# Patient Record
Sex: Female | Born: 1970 | Race: White | Hispanic: No | Marital: Married | Smoking: Never smoker
Health system: Southern US, Community
[De-identification: ages and names within clinical notes are randomized; demographics above are authoritative.]

## PROBLEM LIST (undated history)

## (undated) DIAGNOSIS — J302 Other seasonal allergic rhinitis: Secondary | ICD-10-CM

## (undated) DIAGNOSIS — G629 Polyneuropathy, unspecified: Secondary | ICD-10-CM

## (undated) DIAGNOSIS — H539 Unspecified visual disturbance: Secondary | ICD-10-CM

## (undated) DIAGNOSIS — R519 Headache, unspecified: Secondary | ICD-10-CM

## (undated) DIAGNOSIS — E890 Postprocedural hypothyroidism: Secondary | ICD-10-CM

## (undated) DIAGNOSIS — C73 Malignant neoplasm of thyroid gland: Secondary | ICD-10-CM

## (undated) DIAGNOSIS — J189 Pneumonia, unspecified organism: Secondary | ICD-10-CM

## (undated) DIAGNOSIS — E7800 Pure hypercholesterolemia, unspecified: Secondary | ICD-10-CM

## (undated) DIAGNOSIS — R590 Localized enlarged lymph nodes: Secondary | ICD-10-CM

## (undated) DIAGNOSIS — D649 Anemia, unspecified: Secondary | ICD-10-CM

## (undated) DIAGNOSIS — F40243 Fear of flying: Secondary | ICD-10-CM

## (undated) DIAGNOSIS — K219 Gastro-esophageal reflux disease without esophagitis: Secondary | ICD-10-CM

## (undated) HISTORY — PX: DILATION AND CURETTAGE OF UTERUS: SHX78

## (undated) HISTORY — DX: Pure hypercholesterolemia, unspecified: E78.00

## (undated) HISTORY — DX: Malignant neoplasm of thyroid gland: C73

## (undated) HISTORY — DX: Polyneuropathy, unspecified: G62.9

## (undated) HISTORY — DX: Unspecified visual disturbance: H53.9

## (undated) HISTORY — PX: THYROIDECTOMY, PARTIAL: SHX18

## (undated) HISTORY — DX: Other seasonal allergic rhinitis: J30.2

## (undated) HISTORY — DX: Fear of flying: F40.243

## (undated) HISTORY — DX: Postprocedural hypothyroidism: E89.0

---

## 2006-06-11 ENCOUNTER — Ambulatory Visit (HOSPITAL_COMMUNITY): Admission: RE | Admit: 2006-06-11 | Discharge: 2006-06-11 | Payer: Self-pay | Admitting: Obstetrics and Gynecology

## 2006-06-20 ENCOUNTER — Inpatient Hospital Stay (HOSPITAL_COMMUNITY): Admission: AD | Admit: 2006-06-20 | Discharge: 2006-06-22 | Payer: Self-pay | Admitting: Obstetrics and Gynecology

## 2006-12-07 ENCOUNTER — Other Ambulatory Visit: Admission: RE | Admit: 2006-12-07 | Discharge: 2006-12-07 | Payer: Self-pay | Admitting: Obstetrics and Gynecology

## 2013-11-21 ENCOUNTER — Other Ambulatory Visit: Payer: Self-pay | Admitting: Family Medicine

## 2013-11-21 DIAGNOSIS — E049 Nontoxic goiter, unspecified: Secondary | ICD-10-CM

## 2013-11-30 ENCOUNTER — Ambulatory Visit
Admission: RE | Admit: 2013-11-30 | Discharge: 2013-11-30 | Disposition: A | Discharge status: Home / Self Care | Payer: BC Managed Care – PPO | Source: Ambulatory Visit | Attending: Family Medicine | Admitting: Family Medicine

## 2013-11-30 DIAGNOSIS — E049 Nontoxic goiter, unspecified: Secondary | ICD-10-CM

## 2013-12-21 ENCOUNTER — Ambulatory Visit (INDEPENDENT_AMBULATORY_CARE_PROVIDER_SITE_OTHER): Payer: BC Managed Care – PPO | Admitting: Internal Medicine

## 2013-12-21 ENCOUNTER — Encounter: Payer: Self-pay | Admitting: Internal Medicine

## 2013-12-21 VITALS — BP 106/68 | HR 83 | Temp 98.1°F | Resp 12 | Ht 67.0 in | Wt 182.0 lb

## 2013-12-21 DIAGNOSIS — E7800 Pure hypercholesterolemia, unspecified: Secondary | ICD-10-CM

## 2013-12-21 DIAGNOSIS — E042 Nontoxic multinodular goiter: Secondary | ICD-10-CM

## 2013-12-21 HISTORY — DX: Pure hypercholesterolemia, unspecified: E78.00

## 2013-12-21 LAB — TSH: TSH: 1.24 u[IU]/mL (ref 0.35–4.50)

## 2013-12-21 LAB — T3, FREE: T3 FREE: 2.6 pg/mL (ref 2.3–4.2)

## 2013-12-21 LAB — T4, FREE: Free T4: 0.85 ng/dL (ref 0.60–1.60)

## 2013-12-21 NOTE — Progress Notes (Signed)
Patient ID: Barbara Cardenas, female   DOB: [birth year], 43 y.o.   MRN: [id number]   HPI  Barbara Cardenas is a 43 y.o.-year-old female, referred by her PCP, Dr. Drema Dallas in consultation for multiple thyroid nodules.  She saw her PCP for tremors, palpitations, tingling all over her body, fatigue, R posterior neck pain, HA, insomnia. She checked TSH >> normal. Her thyroid was felt to be enlarged >> sent for thyroid U/S.   Thyroid U/S (11/30/2013) - reviewed images with the pt - she has 3 small nodules in the R lobe: Right thyroid lobe: 49 x 15 x 16 mm.   9 x 6 x 9 mm hypoechoic solid nodule, mid lobe.   Adjacent echogenic 8 x 5 x 6 mm nodule.   Medially is a 10 x 8 x 10 mm nodule with calcific rim.  Left thyroid lobe: 41 x 13 x 13 mm.   Two small 2 mm cysts in the mid lobe. No solid nodule.  Isthmus Thickness: 1 mm. No nodules visualized.  Lymphadenopathy None visualized.  I reviewed pt's thyroid tests - normal: 11/18/2013: TSH 1.71, fT4 0.77   Pt denies feeling nodules in neck, hoarseness, dysphagia/odynophagia, SOB with lying down.  Pt c/o: - + tremors - no heat intolerance  - no palpitations now but occasionally  - + anxiety/no depression - + hyperdefecation - no weight loss, + weight gain 15 lbs in the last 1.5 years - no dry skin - + hair falling - + fatigue  Pt does not have a FH of thyroid ds. No FH of thyroid cancer. No h/o radiation tx to head or neck.  Eating more seaweed lately (sushi), no recent contrast studies. No steroid use. No herbal supplements.   ROS: Constitutional: see HPI Eyes: no blurry vision, no xerophthalmia ENT: + sore throat, no nodules palpated in throat, + occasional dysphagia/odynophagia, no hoarseness, + tinnitus Cardiovascular: no CP/+ SOB/+ palpitations/no leg swelling Respiratory: no cough/+ SOB Gastrointestinal: no N/V/D/C/+ acid reflux Musculoskeletal: no muscle/joint aches Skin: no rashes Neurological: no  tremors/numbness/tingling/dizziness, + HA Psychiatric: no depression/+ anxiety  PMH: - anxiety - HL  PSxH: - D and C after miscarriage in 2004  History   Social History  . Marital Status: Married    Spouse Name: N/A    Number of Children: 3: 55, 60 and 44 y/o   Occupational History  . Stay at home mom   Social History Main Topics  . Smoking status: Never Smoker   . Smokeless tobacco: Not on file  . Alcohol Use: Occasional wine 1 glass/mo  . Drug Use: No   Current Outpatient Rx  Name  Route  Sig  Dispense  Refill  . Adapalene-Benzoyl Peroxide (EPIDUO) 0.1-2.5 % gel   Topical   Apply topically at bedtime.         . ALPRAZolam (XANAX) 0.5 MG tablet   Oral   Take 0.5 mg by mouth as needed for anxiety.         . Beclomethasone Dipropionate (QNASL) 80 MCG/ACT AERS   Nasal   Place into the nose.         . levocetirizine (XYZAL) 5 MG tablet   Oral   Take 5 mg by mouth every evening.         . montelukast (SINGULAIR) 10 MG tablet   Oral   Take 10 mg by mouth at bedtime. 1 tablet in the evening         . TRETINOIN PO  Oral   Take 45 g by mouth daily.          NKDA  FH: see HPI  PE: BP 106/68  Pulse 83  Temp(Src) 98.1 F (36.7 C) (Oral)  Resp 12  Ht 5\' 7"  (1.702 m)  Wt 182 lb (82.555 kg)  BMI 28.50 kg/m2  SpO2 97% Wt Readings from Last 3 Encounters:  12/21/13 182 lb (82.555 kg)   Constitutional: overweight, in NAD Eyes: PERRLA, EOMI, no exophthalmos ENT: moist mucous membranes, + R thyroid lobe prominent, no cervical lymphadenopathy Cardiovascular: RRR, No MRG Respiratory: CTA B Gastrointestinal: abdomen soft, NT, ND, BS+ Musculoskeletal: no deformities, strength intact in all 4;  Skin: moist, warm, no rashes Neurological: no tremor with outstretched hands, DTR normal in all 4  ASSESSMENT: 1. MNG - thyroid U/S: 3 small nodules in R lobe  PLAN: 1. MNG  - I reviewed the images of her thyroid ultrasound along with the patient. I  pointed out that the nodules are small. One is hyperechoic (which carries a lower risk of cancer;  without calcifications. One of them has internal blood flow, which may mean increased activity. Pt does not have a thyroid cancer family history or a personal history of RxTx to head/neck. All these would favor benignity.  - the only way that we can tell exactly if it is cancer or not is by doing a thyroid biopsy (FNA), but this is usually not mandated for nodules <1.5 cm. - I suggested to repeat another thyroid U/S in 1 year and see if any of the nodules grow and Bx the growing ones if we need to - we reviewed her previous TSH level from a month ago >> normal - we decided to recheck her thyroid tests today, especially since she has symptoms that are indicative of hyperthyroidism and she has 1 thyroid nodule that appears to have more activity. If the TSH returns low, will check an uptake and scan. If the TSH returns normal, I advised her to let me know if her symptoms worsen and we'll repeat a TSH at that time. - she should also let me know if she develops neck compression symptoms - I'll see her back in a year - I will send her the results through my chart  Office Visit on 12/21/2013  Component Date Value Ref Range Status  . TSH 12/21/2013 1.24  0.35 - 4.50 uIU/mL Final  . Free T4 12/21/2013 0.85  0.60 - 1.60 ng/dL Final  . T3, Free 12/21/2013 2.6  2.3 - 4.2 pg/mL Final   The thyroid tests are normal.  - time spent with the patient: 1 hour, of which >50% was spent in obtaining information about her symptoms, reviewing her previous labs, evaluations, and treatments, counseling her about her condition (please see the discussed topics above), and developing a plan to further investigate it. she had a number of questions which I addressed.

## 2013-12-21 NOTE — Patient Instructions (Signed)
Please stop at the lab.  Please let me know if your symptoms worsen or if you develop problems swallowing, persistent cough, hoarseness.   Please return in 1 year.

## 2014-06-29 ENCOUNTER — Other Ambulatory Visit: Payer: Self-pay

## 2014-06-29 DIAGNOSIS — Z1231 Encounter for screening mammogram for malignant neoplasm of breast: Secondary | ICD-10-CM

## 2014-07-12 ENCOUNTER — Ambulatory Visit
Admission: RE | Admit: 2014-07-12 | Discharge: 2014-07-12 | Disposition: A | Discharge status: Home / Self Care | Payer: BLUE CROSS/BLUE SHIELD | Source: Ambulatory Visit

## 2014-07-12 DIAGNOSIS — Z1231 Encounter for screening mammogram for malignant neoplasm of breast: Secondary | ICD-10-CM

## 2014-08-15 ENCOUNTER — Other Ambulatory Visit: Payer: Self-pay | Admitting: Gastroenterology

## 2014-08-15 DIAGNOSIS — R1310 Dysphagia, unspecified: Secondary | ICD-10-CM

## 2014-08-16 ENCOUNTER — Other Ambulatory Visit: Payer: Self-pay | Admitting: Obstetrics and Gynecology

## 2014-08-16 ENCOUNTER — Other Ambulatory Visit (HOSPITAL_COMMUNITY)
Admission: RE | Admit: 2014-08-16 | Discharge: 2014-08-16 | Disposition: A | Discharge status: Home / Self Care | Payer: BLUE CROSS/BLUE SHIELD | Source: Ambulatory Visit | Attending: Obstetrics and Gynecology | Admitting: Obstetrics and Gynecology

## 2014-08-16 DIAGNOSIS — Z01419 Encounter for gynecological examination (general) (routine) without abnormal findings: Secondary | ICD-10-CM | POA: Diagnosis not present

## 2014-08-16 DIAGNOSIS — Z1151 Encounter for screening for human papillomavirus (HPV): Secondary | ICD-10-CM | POA: Diagnosis present

## 2014-08-17 LAB — CYTOLOGY - PAP

## 2014-08-23 ENCOUNTER — Ambulatory Visit
Admission: RE | Admit: 2014-08-23 | Discharge: 2014-08-23 | Disposition: A | Discharge status: Home / Self Care | Payer: BLUE CROSS/BLUE SHIELD | Source: Ambulatory Visit | Attending: Gastroenterology | Admitting: Gastroenterology

## 2014-08-23 DIAGNOSIS — R1310 Dysphagia, unspecified: Secondary | ICD-10-CM

## 2014-11-20 ENCOUNTER — Telehealth: Payer: Self-pay | Admitting: Internal Medicine

## 2014-11-20 NOTE — Telephone Encounter (Signed)
Please read message below and advise.  

## 2014-11-20 NOTE — Telephone Encounter (Signed)
Please call pt to let her know if she needs to have another [place] before she returns for her 1 yr fu

## 2014-11-20 NOTE — Telephone Encounter (Signed)
No, will establish if she needs one when she comes back.

## 2014-11-20 NOTE — Telephone Encounter (Signed)
Called pt and lvm advising her per Dr Arman Filter message below. Advised pt to call back w/ any further questions.

## 2014-11-28 ENCOUNTER — Encounter: Payer: Self-pay | Admitting: Internal Medicine

## 2015-02-07 ENCOUNTER — Ambulatory Visit: Payer: BLUE CROSS/BLUE SHIELD | Admitting: Internal Medicine

## 2015-04-02 ENCOUNTER — Ambulatory Visit: Payer: BLUE CROSS/BLUE SHIELD | Admitting: Internal Medicine

## 2015-04-02 ENCOUNTER — Ambulatory Visit (INDEPENDENT_AMBULATORY_CARE_PROVIDER_SITE_OTHER): Payer: BLUE CROSS/BLUE SHIELD | Admitting: Internal Medicine

## 2015-04-02 ENCOUNTER — Encounter: Payer: Self-pay | Admitting: Internal Medicine

## 2015-04-02 VITALS — BP 118/68 | HR 84 | Temp 97.7°F | Resp 12 | Wt 182.6 lb

## 2015-04-02 DIAGNOSIS — E042 Nontoxic multinodular goiter: Secondary | ICD-10-CM | POA: Diagnosis not present

## 2015-04-02 LAB — TSH: TSH: 1.88 u[IU]/mL (ref 0.35–4.50)

## 2015-04-02 LAB — T4, FREE: FREE T4: 0.73 ng/dL (ref 0.60–1.60)

## 2015-04-02 LAB — T3, FREE: T3, Free: 3.3 pg/mL (ref 2.3–4.2)

## 2015-04-02 NOTE — Progress Notes (Addendum)
Patient ID: Barbara Cardenas, female   DOB: 1970-05-13, 45 y.o.   MRN: [id number]   HPI  Barbara Cardenas is a 45 y.o.-year-old female, initially referred by her PCP, Dr. Drema Dallas, now returning for f/u for  multiple thyroid nodules.  Reviewed hx: She initially saw her PCP for tremors, palpitations, tingling all over her body, fatigue, R posterior neck pain, HA, insomnia. She checked TSH >> normal. Her thyroid was felt to be enlarged >> sent for thyroid U/S.   Reviewed available imaging test images and reports: Thyroid U/S (11/30/2013) - reviewed images with the pt - she has 3 small nodules in the R lobe: Right thyroid lobe: 49 x 15 x 16 mm.   9 x 6 x 9 mm hypoechoic solid nodule, mid lobe.   Adjacent echogenic 8 x 5 x 6 mm nodule.   Medially is a 10 x 8 x 10 mm nodule with calcific rim.  Left thyroid lobe: 41 x 13 x 13 mm.   Two small 2 mm cysts in the mid lobe. No solid nodule.  Isthmus Thickness: 1 mm. No nodules visualized.  Lymphadenopathy None visualized.      Barium swallow (08/23/2014): no external compression  I reviewed pt's thyroid tests - normal: Lab Results  Component Value Date   TSH 1.24 12/21/2013   FREET4 0.85 12/21/2013  11/18/2013: TSH 1.71, fT4 0.77   Pt denies feeling nodules in neck, hoarseness, dysphagia/odynophagia, SOB with lying down. She had dysphagia >> saw GI >> started acid reflux meds >> 90% better.  Pt denies: - fatigue - weight loss or gain - tremors - heat intolerance  - palpitations - anxiety/depression - constipation/hyperdefecation - dry skin - hair loss  Pt does not have a FH of thyroid ds. No FH of thyroid cancer. No h/o radiation tx to head or neck.  Eating more seaweed lately (sushi), no recent contrast studies. No steroid use. No herbal supplements.   ROS: Constitutional: no weight gain/loss, no fatigue, no subjective hyperthermia/hypothermia Eyes: no blurry vision, no xerophthalmia ENT: no sore throat, no nodules  palpated in throat, no dysphagia/odynophagia, no hoarseness Cardiovascular: no CP/SOB/palpitations/leg swelling Respiratory: no cough/SOB Gastrointestinal: no N/V/D/C Musculoskeletal: no muscle/joint aches Skin: no rashes Neurological: no tremors/numbness/tingling/dizziness  PMH: - anxiety - HL  PSxH: - D and C after miscarriage in 2004  History   Social History  . Marital Status: Married    Spouse Name: N/A    Number of Children: 3: 13, 19 and 10 y/o   Occupational History  . Stay at home mom   Social History Main Topics  . Smoking status: Never Smoker   . Smokeless tobacco: Not on file  . Alcohol Use: Occasional wine 1 glass/mo  . Drug Use: No   Current Outpatient Rx  Name  Route  Sig  Dispense  Refill  . Adapalene-Benzoyl Peroxide (EPIDUO) 0.1-2.5 % gel   Topical   Apply topically at bedtime.         . ALPRAZolam (XANAX) 0.5 MG tablet   Oral   Take 0.5 mg by mouth as needed for anxiety.         . Beclomethasone Dipropionate (QNASL) 80 MCG/ACT AERS   Nasal   Place into the nose.         . levocetirizine (XYZAL) 5 MG tablet   Oral   Take 5 mg by mouth every evening.         . montelukast (SINGULAIR) 10 MG tablet   Oral  Take 10 mg by mouth at bedtime. 1 tablet in the evening         . TRETINOIN PO   Oral   Take 45 g by mouth daily.          NKDA  FH: see HPI  PE: BP 118/68 mmHg  Pulse 84  Temp(Src) 97.7 F (36.5 C) (Oral)  Resp 12  Wt 182 lb 9.6 oz (82.827 kg)  SpO2 98% Wt Readings from Last 3 Encounters:  04/02/15 182 lb 9.6 oz (82.827 kg)  12/21/13 182 lb (82.555 kg)   Constitutional: overweight, in NAD Eyes: PERRLA, EOMI, no exophthalmos ENT: moist mucous membranes, + R thyroid lobe prominent - R medial nodule palpable, firm; no cervical lymphadenopathy Cardiovascular: RRR, No MRG Respiratory: CTA B Gastrointestinal: abdomen soft, NT, ND, BS+ Musculoskeletal: no deformities, strength intact in all 4;  Skin: moist, warm,  no rashes Neurological: no tremor with outstretched hands, DTR normal in all 4  ASSESSMENT: 1. MNG - thyroid U/S: 3 small nodules in R lobe  PLAN: 1. MNG  - I reviewed the images of her thyroid ultrasound along with the patient. I pointed out that the nodules are small. One is hyperechoic (which carries a lower risk of cancer;  without calcifications. One of them has internal blood flow, which may mean increased activity or cancer. 2 of the nodules are adjacent. Their contours are not well defined. Pt does not have a thyroid cancer family history or a personal history of RxTx to head/neck, favoring  benignity.  - I suggested to repeat another thyroid U/S now. If the nodules are not decreasing, I would be prone to Bx them as they have several features that require attention:the dominant nodule has interrupted peripheral calcifications, and they all have increased blood flow, hypoechogenicity. She agrees with the plan. We discussed about ThyCa as a very indolent cancer, which, however, require thyroidectomy  - we reviewed her previous TSH level from 1 year ago >> normal >> will repeat today. If the TSH returns low, will check an uptake and scan.  - I'll see her back in a year - I will send her the results through my chart  Office Visit on 04/02/2015  Component Date Value Ref Range Status  . Free T4 04/02/2015 0.73  0.60 - 1.60 ng/dL Final  . T3, Free 04/02/2015 3.3  2.3 - 4.2 pg/mL Final  . TSH 04/02/2015 1.88  0.35 - 4.50 uIU/mL Final   Normal TFTs.  Thyroid U/S (04/18/2015):  Right thyroid lobe: 52 x 14 x 17 mm.   10 x 9 x 11 mm hypoechoic solid nodule with peripheral calcification, mid lobe near isthmus (previously 10 x 8 x 10).    9 x 7 x 9 mm hypoechoic nodule, mid lobe (previously 9 x 6 x 9).   Adjacent 5 x 7 x 7 mm hypoechoic nodule inferiorly (previously 8 x 5 x 6). Regional hyperemia. Left thyroid lobe: 41 x 10 x 13 mm. 2 mm cyst, mid lobe. Isthmus Thickness: 1.4 mm. 6 mm  elongated nodule, left of midline. Lymphadenopathy None visualized.  IMPRESSION: 1. Little change in small thyroid nodules. Findings do not meet current consensus criteria for biopsy.   Per our discussion, I would suggest biopsy of the 2 largest nodules in the right lobe.  Adequacy Reason Satisfactory For Evaluation. Diagnosis THYROID, RIGHT MIDDLE POLE NEAR ISTHMUS #1, FINE NEEDLE ASPIRATION (SPECIMEN 1 OF 2, COLLECTED ON 05/15/15): SUSPICIOUS FOR PAPILLARY THYROID CARCINOMA ([place]DA CATEGORY V). ROBERT  HILLARD MD Pathologist, Electronic Signature (Case signed 05/17/2015) Specimen Clinical Information 10 x 9 x 11 mm hypoechoic solid nodule with peripheral calcification, Right mid lobe near isthmus (previously 10 x 8 x 10).  Source  Thyroid, Fine Needle Aspiration, RT Lobe RMP Near Isthmus #1, (Specimen 1 of 2, collected on 05/15/15 )  Adequacy Reason Satisfactory For Evaluation. Diagnosis THYROID, RIGHT MIDDLE POLE #2, FINE NEEDLE ASPIRATION (SPECIMEN 2 OF 2, COLLECTED ON 04/26/15): SUSPICIOUS FOR PAPILLARY THYROID CARCINOMA (BETHESDA CATEGORY V). Willeen Niece MD Pathologist, Electronic Signature (Case signed 05/17/2015) Specimen Clinical Information 9 x 7 x 9 mm hypoechoic nodule, Right mid lobe (previously 9 x 6 x 9). Source Thyroid, Fine Needle Aspiration, RT Lobe RMP #2, (Specimen 2 of 2, collected on 05/15/15 )  I will suggest thyroidectomy >> refer to Sx >> I will see her back 1 mo after her surgery. Called pt >> agrees with plan.

## 2015-04-02 NOTE — Patient Instructions (Signed)
Please stop at the lab.  We will schedule a thyroid U/S.  Please return in 1 year.

## 2015-04-09 ENCOUNTER — Telehealth: Payer: Self-pay | Admitting: Internal Medicine

## 2015-04-09 NOTE — Telephone Encounter (Signed)
Patient stated that no one has called her to schedule appointment for her ultra sound, please advise

## 2015-04-09 NOTE — Telephone Encounter (Signed)
Please read message below and advise.  

## 2015-04-10 ENCOUNTER — Other Ambulatory Visit: Payer: Self-pay | Admitting: Internal Medicine

## 2015-04-10 DIAGNOSIS — E042 Nontoxic multinodular goiter: Secondary | ICD-10-CM

## 2015-04-10 NOTE — Telephone Encounter (Signed)
Barbara Cardenas, can you please schedule this for her in [place] today?

## 2015-04-18 ENCOUNTER — Ambulatory Visit
Admission: RE | Admit: 2015-04-18 | Discharge: 2015-04-18 | Disposition: A | Discharge status: Home / Self Care | Payer: BLUE CROSS/BLUE SHIELD | Source: Ambulatory Visit | Attending: Internal Medicine | Admitting: Internal Medicine

## 2015-05-09 ENCOUNTER — Other Ambulatory Visit: Payer: Self-pay | Admitting: Emergency Medicine

## 2015-05-15 ENCOUNTER — Ambulatory Visit
Admission: RE | Admit: 2015-05-15 | Discharge: 2015-05-15 | Disposition: A | Discharge status: Home / Self Care | Payer: BLUE CROSS/BLUE SHIELD | Source: Ambulatory Visit | Attending: Internal Medicine | Admitting: Internal Medicine

## 2015-05-15 ENCOUNTER — Other Ambulatory Visit (HOSPITAL_COMMUNITY)
Admission: RE | Admit: 2015-05-15 | Discharge: 2015-05-15 | Disposition: A | Discharge status: Home / Self Care | Payer: BLUE CROSS/BLUE SHIELD | Source: Ambulatory Visit | Attending: Diagnostic Radiology | Admitting: Diagnostic Radiology

## 2015-05-15 DIAGNOSIS — E042 Nontoxic multinodular goiter: Secondary | ICD-10-CM

## 2015-05-17 NOTE — Addendum Note (Signed)
Addended by: Philemon Kingdom on: 05/17/2015 02:06 PM   Modules accepted: Orders

## 2015-05-22 ENCOUNTER — Ambulatory Visit: Payer: Self-pay | Admitting: Surgery

## 2015-05-24 ENCOUNTER — Encounter: Payer: Self-pay | Admitting: Internal Medicine

## 2015-05-24 ENCOUNTER — Telehealth: Payer: Self-pay | Admitting: Internal Medicine

## 2015-05-24 NOTE — Telephone Encounter (Signed)
Patient stated that he is going to [facility] tomorrow to get a second opinion, the only thing is missing is her needle aspiration report, fax to the referral coordinator   Fax # [phone] phone # [phone]

## 2015-05-25 NOTE — Telephone Encounter (Signed)
Report printed and faxed to the number listed below.

## 2015-05-25 NOTE — Telephone Encounter (Signed)
Barbara Cardenas, can you look through her chart for this and fax it? I'm pretty sure I faxed it along with the other notes. It is in imaging. Will you please help with this for the pt? Thanks for your help!

## 2015-05-31 ENCOUNTER — Ambulatory Visit (INDEPENDENT_AMBULATORY_CARE_PROVIDER_SITE_OTHER): Payer: BLUE CROSS/BLUE SHIELD | Admitting: Neurology

## 2015-05-31 ENCOUNTER — Encounter: Payer: Self-pay | Admitting: Neurology

## 2015-05-31 VITALS — BP 110/78 | HR 88 | Resp 20 | Ht 68.0 in | Wt 181.0 lb

## 2015-05-31 DIAGNOSIS — H539 Unspecified visual disturbance: Secondary | ICD-10-CM

## 2015-05-31 DIAGNOSIS — G629 Polyneuropathy, unspecified: Secondary | ICD-10-CM | POA: Diagnosis not present

## 2015-05-31 DIAGNOSIS — R202 Paresthesia of skin: Secondary | ICD-10-CM | POA: Diagnosis not present

## 2015-05-31 DIAGNOSIS — G8929 Other chronic pain: Secondary | ICD-10-CM | POA: Diagnosis not present

## 2015-05-31 DIAGNOSIS — M2550 Pain in unspecified joint: Secondary | ICD-10-CM

## 2015-05-31 DIAGNOSIS — M6281 Muscle weakness (generalized): Secondary | ICD-10-CM | POA: Insufficient documentation

## 2015-05-31 DIAGNOSIS — R251 Tremor, unspecified: Secondary | ICD-10-CM

## 2015-05-31 DIAGNOSIS — M545 Low back pain, unspecified: Secondary | ICD-10-CM

## 2015-05-31 DIAGNOSIS — M6289 Other specified disorders of muscle: Secondary | ICD-10-CM

## 2015-05-31 NOTE — Progress Notes (Signed)
[facility]    Provider:  Dr Jaynee Eagles Referring Provider: Leighton Ruff, MD Primary Care Physician:  Gerrit Heck, MD  CC:  Right-sided pain  HPI:  Barbara Cardenas is a 45 y.o. female here as a referral from Dr. Drema Dallas for right-sided pain in the low back and shoulder joint pain and knee pain. PMHx papillary thyroid cancer, anxiety, GERD, hypercholesterolemia, paresthesias, arthralgia, myalgia. She feels the pain in her lower extremity radiates from the abdomen to the groin and into the low back. She can't lay on her right side to sleep at night. She denies radicular symptoms. She had numbness and tingling in the righ arm and right leg. She feels like something wet is running down the front of her leg. Down the front of her leg. She feels shaky in the morning. Recently she is having pain in her left foot. She went to [place]. They did not do any imaging there and said she was fine and could not help her. Symptoms started 18 months ago. Getting worse, slowly progressively. She has generalized weakness in the muscles. She has low energy. Difficulty holding arms overhead. Weakness comes and goes. No difficulty getting out of low seats or climbing stairs. Father had an autoimmune disorder, ulcerative colitis. She has joint pain every day. She has lower back pain every day. In the hip and shooulder as far as joint pain. She has lower back pain all the time. When she bends forward she feels like she is shaky. She has hearing changes. She has brief visual changes.  This is significantly affecting her life and she cries in hte office today. Denies diplopia, dysphagia, dysarthria, change in bowel bladder.  She denies mood diroders, systemic signs.   Reviewed notes, labs and imaging from outside physicians, which showed: RF, cbc, cmp, ANA IFA, CK, aldolase unremarkable.   Reviewed notes from [facility] physicians. Patient reported continued right-sided pain in her  shoulder, hip elbow and feet. Trouble laying on her right side. Also low back pain. She denied numbness but did endorse tingling in the right leg off and on. Shaky feeling. Endorse her symptoms getting worse. She was seen for this a number of months previous appointment and sent over to orthopedics to did not see anything wrong. She has done research over the inernet and is afraid that there is something worse going on. She was diagnosed with arthralgia, myalgia, paresthesia. Workup was done including rheumatoid arthritis, aldolase, CBC, CMP, CK which were all unremarkable. She was referred here for further workup.  Review of Systems: Patient complains of symptoms per HPI as well as the following symptoms: ringing in ears, fatigue, joint pain, allergies, not enough sleep . Pertinent negatives per HPI. All others negative.   Social History   Social History  . Marital Status: Married    Spouse Name: N/A  . Number of Children: N/A  . Years of Education: N/A   Occupational History  . Not on file.   Social History Main Topics  . Smoking status: Never Smoker   . Smokeless tobacco: Not on file  . Alcohol Use: No  . Drug Use: No  . Sexual Activity: Not on file   Other Topics Concern  . Not on file   Social History Narrative    Family History  Problem Relation Age of Onset  . Healthy Mother   . CAD Father   . Allergies Father   . Ulcerative colitis Father   . Tremor Father   . Neuropathy  Neg Hx     Past Medical History  Diagnosis Date  . Thyroid cancer ([place])   . Neuropathy ([place])   . Vision abnormalities     Past Surgical History  Procedure Laterality Date  . Biopsy thyroid      Current Outpatient Prescriptions  Medication Sig Dispense Refill  . Adapalene-Benzoyl Peroxide (EPIDUO) 0.1-2.5 % gel Apply topically at bedtime.    . ALPRAZolam (XANAX) 0.5 MG tablet Take 0.5 mg by mouth as needed for anxiety. Reported on 04/02/2015    . Beclomethasone Dipropionate (QNASL) 80  MCG/ACT AERS Place into the nose.    . Cholecalciferol (VITAMIN D PO) Take 4,000 Units by mouth.    . fluticasone (FLONASE) 50 MCG/ACT nasal spray Place into both nostrils daily.    Marland Kitchen levocetirizine (XYZAL) 5 MG tablet Take 5 mg by mouth every evening.    . montelukast (SINGULAIR) 10 MG tablet Take 10 mg by mouth at bedtime. 1 tablet in the evening    . Multiple Vitamin (MULTI-VITAMINS) TABS Take by mouth.     No current facility-administered medications for this visit.    Allergies as of 05/31/2015  . (No Known Allergies)    Vitals: BP 110/78 mmHg  Pulse 88  Resp 20  Ht 5\' 8"  (1.727 m)  Wt 181 lb (82.101 kg)  BMI 27.53 kg/m2 Last Weight:  Wt Readings from Last 1 Encounters:  05/31/15 181 lb (82.101 kg)   Last Height:   Ht Readings from Last 1 Encounters:  05/31/15 5\' 8"  (1.727 m)   Physical exam: Exam: Gen: NAD, conversant                  CV: RRR, no MRG. No Carotid Bruits. No peripheral edema, warm, nontender Eyes: Conjunctivae clear without exudates or hemorrhage  Neuro: Detailed Neurologic Exam  Speech:    Speech is normal; fluent and spontaneous with normal comprehension.  Cognition:    The patient is oriented to person, place, and time;     recent and remote memory intact;     language fluent;     normal attention, concentration,     fund of knowledge Cranial Nerves:    The pupils are equal, round, and reactive to light. The fundi are normal and spontaneous venous pulsations are present. Visual fields are full to finger confrontation. Extraocular movements are intact. Trigeminal sensation is intact and the muscles of mastication are normal. Left ptosis otherwise the face is symmetric. The palate elevates in the midline. Hearing intact. Voice is normal. Shoulder shrug is normal. The tongue has normal motion without fasciculations.   Coordination:    Normal finger to nose and heel to shin. Normal rapid alternating movements.   Gait:    Heel-toe and tandem  gait are normal.   Motor Observation:    No asymmetry, no atrophy, and no involuntary movements noted. Tone:    Normal muscle tone.    Posture:    Posture is normal. normal erect    Strength:    Strength is V/V in the upper and lower limbs.      Sensation: intact to LT     Reflex Exam:  DTR's:    Deep tendon reflexes in the upper and lower extremities are normal bilaterally.   Toes:    The toes are downgoing bilaterally.   Clonus:    Clonus is absent.      Assessment/Plan:  45 year old patient with multiple complaints that have been going on for 18  months and are progressive. Joint pain on the right side, generalized weakness, not feeling good, right-sided joint pain, paresthesias on right side, feeling internally shaky, low energy and fatigue, low back pain, hearing and visual changes. Neuro exam is unremarkable. Very difficult to tie together all these multiple complaints.   Right-sided emg (right arm and leg) Will perform a thorough neuropathy serum screen MRI of the brain   Sarina Ill, MD  [facility] [address] [place], [place] [address]  Phone [phone] Fax [phone]

## 2015-05-31 NOTE — Patient Instructions (Addendum)
Remember to drink plenty of fluid, eat healthy meals and do not skip any meals. Try to eat protein with a every meal and eat a healthy snack such as fruit or nuts in between meals. Try to keep a regular sleep-wake schedule and try to exercise daily, particularly in the form of walking, 20-30 minutes a day, if you can.   As far as diagnostic testing: MRi brain, emg/ncs, labs  Our phone number is [phone]. We also have an after hours call service for urgent matters and there is a physician on-call for urgent questions. For any emergencies you know to call 911 or go to the nearest emergency room

## 2015-06-04 LAB — HEMOGLOBIN A1C
Est. average glucose Bld gHb Est-mCnc: 114 mg/dL
HEMOGLOBIN A1C: 5.6 % (ref 4.8–5.6)

## 2015-06-04 LAB — HEPATITIS C ANTIBODY

## 2015-06-04 LAB — VITAMIN D PNL(25-HYDRXY+1,25-DIHY)-BLD
Vit D, 1,25-Dihydroxy: 77.6 pg/mL (ref 19.9–79.3)
Vit D, 25-Hydroxy: 26 ng/mL — ABNORMAL LOW (ref 30.0–100.0)

## 2015-06-04 LAB — B12 AND FOLATE PANEL
Folate: >20 ng/mL (ref >3.0)
VITAMIN B 12: 762 pg/mL (ref 211–946)

## 2015-06-04 LAB — ANA COMPREHENSIVE PANEL
Anti JO-1: <0.2 AI (ref 0.0–0.9)
Centromere Ab Screen: <0.2 AI (ref 0.0–0.9)
Chromatin Ab SerPl-aCnc: <0.2 AI (ref 0.0–0.9)
DSDNA AB: 2 [IU]/mL (ref 0–9)
ENA RNP AB: 1.9 AI — AB (ref 0.0–0.9)
ENA SSA (RO) Ab: <0.2 AI (ref 0.0–0.9)
ENA SSB (LA) Ab: <0.2 AI (ref 0.0–0.9)
Scleroderma SCL-70: <0.2 AI (ref 0.0–0.9)

## 2015-06-04 LAB — VITAMIN B6: VITAMIN B6: 13 ug/L (ref 2.0–32.8)

## 2015-06-04 LAB — HEAVY METALS, BLOOD
Arsenic: 11 ug/L (ref 2–23)
Lead, Blood: NOT DETECTED ug/dL (ref 0–19)
Mercury: NOT DETECTED ug/L (ref 0.0–14.9)

## 2015-06-04 LAB — SEDIMENTATION RATE: SED RATE: 2 mm/h (ref 0–32)

## 2015-06-04 LAB — ANA: ANA Titer 1: NEGATIVE

## 2015-06-04 LAB — VITAMIN B1: Thiamine: 92.7 nmol/L (ref 66.5–200.0)

## 2015-06-04 LAB — B. BURGDORFI ANTIBODIES: Lyme IgG/IgM Ab: <0.91 {ISR} (ref 0.00–0.90)

## 2015-06-04 LAB — RPR: RPR: NONREACTIVE

## 2015-06-04 LAB — METHYLMALONIC ACID, SERUM: Methylmalonic Acid: 75 nmol/L (ref 0–378)

## 2015-06-04 LAB — HIV ANTIBODY (ROUTINE TESTING W REFLEX): HIV SCREEN 4TH GENERATION: NONREACTIVE

## 2015-06-05 ENCOUNTER — Telehealth: Payer: Self-pay | Admitting: *Deleted

## 2015-06-05 NOTE — Telephone Encounter (Signed)
Called and spoke to pt about unremarkable labs. Pt verbalized understanding.

## 2015-06-05 NOTE — Telephone Encounter (Signed)
-----   Message from Melvenia Beam, MD sent at 06/05/2015 10:46 AM EDT ----- Labs were unremarkable thanks

## 2015-06-13 ENCOUNTER — Ambulatory Visit: Payer: BLUE CROSS/BLUE SHIELD

## 2015-06-20 ENCOUNTER — Other Ambulatory Visit: Payer: Self-pay | Admitting: Obstetrics and Gynecology

## 2015-06-20 DIAGNOSIS — N644 Mastodynia: Secondary | ICD-10-CM

## 2015-06-20 DIAGNOSIS — N6310 Unspecified lump in the right breast, unspecified quadrant: Secondary | ICD-10-CM

## 2015-06-25 ENCOUNTER — Telehealth: Payer: Self-pay | Admitting: Internal Medicine

## 2015-06-25 NOTE — Telephone Encounter (Signed)
FYI: Pt is going to have surgery done at [facility]  She is going to do the CT there as well she could have hyperparathyroidism

## 2015-06-25 NOTE — Telephone Encounter (Signed)
Noted! Reviewed [facility] notes and lab results.

## 2015-06-25 NOTE — Telephone Encounter (Signed)
Please read message below and be advised.  

## 2015-06-26 ENCOUNTER — Ambulatory Visit
Admission: RE | Admit: 2015-06-26 | Discharge: 2015-06-26 | Disposition: A | Discharge status: Home / Self Care | Payer: BLUE CROSS/BLUE SHIELD | Source: Ambulatory Visit | Attending: Obstetrics and Gynecology | Admitting: Obstetrics and Gynecology

## 2015-06-26 DIAGNOSIS — N644 Mastodynia: Secondary | ICD-10-CM

## 2015-06-26 DIAGNOSIS — N6310 Unspecified lump in the right breast, unspecified quadrant: Secondary | ICD-10-CM

## 2015-07-19 ENCOUNTER — Encounter: Payer: BLUE CROSS/BLUE SHIELD | Admitting: Neurology

## 2015-07-30 DIAGNOSIS — Z0000 Encounter for general adult medical examination without abnormal findings: Secondary | ICD-10-CM | POA: Diagnosis not present

## 2015-08-06 DIAGNOSIS — M25511 Pain in right shoulder: Secondary | ICD-10-CM | POA: Diagnosis not present

## 2015-08-06 DIAGNOSIS — M79672 Pain in left foot: Secondary | ICD-10-CM | POA: Diagnosis not present

## 2015-08-06 DIAGNOSIS — K219 Gastro-esophageal reflux disease without esophagitis: Secondary | ICD-10-CM | POA: Diagnosis not present

## 2015-08-06 DIAGNOSIS — M25552 Pain in left hip: Secondary | ICD-10-CM | POA: Diagnosis not present

## 2015-08-06 DIAGNOSIS — C73 Malignant neoplasm of thyroid gland: Secondary | ICD-10-CM | POA: Diagnosis not present

## 2015-08-06 DIAGNOSIS — M79671 Pain in right foot: Secondary | ICD-10-CM | POA: Diagnosis not present

## 2015-08-06 DIAGNOSIS — E210 Primary hyperparathyroidism: Secondary | ICD-10-CM | POA: Diagnosis not present

## 2015-08-06 DIAGNOSIS — J309 Allergic rhinitis, unspecified: Secondary | ICD-10-CM | POA: Diagnosis not present

## 2015-08-06 DIAGNOSIS — D351 Benign neoplasm of parathyroid gland: Secondary | ICD-10-CM | POA: Diagnosis not present

## 2015-08-17 DIAGNOSIS — E210 Primary hyperparathyroidism: Secondary | ICD-10-CM | POA: Diagnosis not present

## 2015-08-17 DIAGNOSIS — C73 Malignant neoplasm of thyroid gland: Secondary | ICD-10-CM | POA: Diagnosis not present

## 2015-08-22 ENCOUNTER — Ambulatory Visit (INDEPENDENT_AMBULATORY_CARE_PROVIDER_SITE_OTHER): Payer: BLUE CROSS/BLUE SHIELD | Admitting: Neurology

## 2015-08-22 ENCOUNTER — Ambulatory Visit (INDEPENDENT_AMBULATORY_CARE_PROVIDER_SITE_OTHER): Payer: Self-pay | Admitting: Neurology

## 2015-08-22 DIAGNOSIS — R202 Paresthesia of skin: Secondary | ICD-10-CM

## 2015-08-22 DIAGNOSIS — M791 Myalgia, unspecified site: Secondary | ICD-10-CM

## 2015-08-22 DIAGNOSIS — Z0289 Encounter for other administrative examinations: Secondary | ICD-10-CM

## 2015-08-22 DIAGNOSIS — M359 Systemic involvement of connective tissue, unspecified: Secondary | ICD-10-CM

## 2015-08-22 DIAGNOSIS — G629 Polyneuropathy, unspecified: Secondary | ICD-10-CM

## 2015-08-22 DIAGNOSIS — M6281 Muscle weakness (generalized): Secondary | ICD-10-CM

## 2015-08-22 DIAGNOSIS — M545 Low back pain, unspecified: Secondary | ICD-10-CM

## 2015-08-22 DIAGNOSIS — M2550 Pain in unspecified joint: Secondary | ICD-10-CM

## 2015-08-22 DIAGNOSIS — G8929 Other chronic pain: Secondary | ICD-10-CM

## 2015-08-22 NOTE — Progress Notes (Signed)
  [facility]    Provider:  Dr Jaynee Eagles Referring Provider: Leighton Ruff, MD Primary Care Physician:  Gerrit Heck, MD  History: 45 year old patient with multiple complaints that have been going on for 18 months and are progressive. Joint pain on the right side, generalized weakness, not feeling good, right-sided joint pain, paresthesias on right side, feeling internally shaky, low energy and fatigue, low back pain, hearing and visual changes. Neuro exam is unremarkable. Very difficult to tie together all these multiple complaints.   Summary  Nerve conduction studies were performed on the right upper and right lower extremities:  Right APB Median motor nerve showed normal conductions with normal F Wave latency Right ADM Ulnar motor nerves showed normal conductions with normal F Wave latency The right Peroneal motor nerve showed normal conductions with normal F Wave latency The right Tibial motor nerve showed normal conductions with normal F Wave latency The right second-digit Median sensory nerve conduction was within normal limits The right fifth-digit Ulnar sensory nerve conduction was within normal limits The right Peroneal sensory nerve conduction was within normal limits Right H Reflex showed normal latency  EMG Needle study was performed on selected right upper and lower extremity muscles:   The Deltoid, Triceps, Pronator Teres, Opponens Pollicis, Flexor Digitorum Profundus, Iliopsoas, Biceps Femoris(long head), Gluteus Maximus, Lumbar paraspinals, Vastus Medialis, Anterior Tibialis, Medial Gastrocnemius, Extensor Hallucis Longus and Abductor Hallucis muscles were within normal limits.  Conclusion: This is a normal study. No electrophysiologic evidence for peripheral polyneuropathy, mononeuropathy, radiculopathy, or neuromuscular disorder.    Also discussed low vitamin D levels, she is on supplementation now. Did an extensive serum panel, was only  positive for ENA RNP antibodies 1.9 (ULN 0.9) wil negative ANA titers. I feel this is likely not clinically relevant but given all her joint complaints she would like to see rheumatology. Will let them review the consult and see if they feel this is clinically warranted for consult.    Assessment/Plan:    Sarina Ill, MD  [facility] [address] [place], [place] [address]  Phone [phone] Fax [phone]

## 2015-08-22 NOTE — Progress Notes (Signed)
See procedure note.

## 2015-08-23 ENCOUNTER — Other Ambulatory Visit: Payer: BLUE CROSS/BLUE SHIELD

## 2015-08-24 NOTE — Procedures (Signed)
[facility]    Provider:  Dr Jaynee Eagles Referring Provider: Leighton Ruff, MD Primary Care Physician:  Gerrit Heck, MD  History: 45 year old patient with multiple complaints that have been going on for 18 months and are progressive. Joint pain on the right side, generalized weakness, not feeling good, right-sided joint pain, paresthesias on right side, feeling internally shaky, low energy and fatigue, low back pain, hearing and visual changes. Neuro exam is unremarkable. Very difficult to tie together all these multiple complaints.   Summary  Nerve conduction studies were performed on the right upper and right lower extremities:  Right APB Median motor nerve showed normal conductions with normal F Wave latency Right ADM Ulnar motor nerves showed normal conductions with normal F Wave latency The right Peroneal motor nerve showed normal conductions with normal F Wave latency The right Tibial motor nerve showed normal conductions with normal F Wave latency The right second-digit Median sensory nerve conduction was within normal limits The right fifth-digit Ulnar sensory nerve conduction was within normal limits The right Peroneal sensory nerve conduction was within normal limits Right H Reflex showed normal latency  EMG Needle study was performed on selected right upper and lower extremity muscles:   The Deltoid, Triceps, Pronator Teres, Opponens Pollicis, Flexor Digitorum Profundus, Iliopsoas, Biceps Femoris(long head), Gluteus Maximus, Lumbar paraspinals, Vastus Medialis, Anterior Tibialis, Medial Gastrocnemius, Extensor Hallucis Longus and Abductor Hallucis muscles were within normal limits.  Conclusion: This is a normal study. No electrophysiologic evidence for peripheral polyneuropathy, mononeuropathy, radiculopathy, or neuromuscular disorder.    Also discussed low vitamin D levels, she is on supplementation now. Did an extensive serum panel, was only  positive for ENA RNP antibodies 1.9 (ULN 0.9) wil negative ANA titers. I feel this is likely not clinically relevant but given all her joint complaints she would like to see rheumatology. Will let them review the consult and see if they feel this is clinically warranted for consult.    Assessment/Plan:    Sarina Ill, MD  [facility] [address] [place], [place] [address]  Phone [phone] Fax [phone]

## 2015-09-03 ENCOUNTER — Ambulatory Visit: Payer: BLUE CROSS/BLUE SHIELD | Admitting: Neurology

## 2015-09-07 DIAGNOSIS — C73 Malignant neoplasm of thyroid gland: Secondary | ICD-10-CM | POA: Diagnosis not present

## 2015-09-07 DIAGNOSIS — E890 Postprocedural hypothyroidism: Secondary | ICD-10-CM

## 2015-09-07 HISTORY — DX: Malignant neoplasm of thyroid gland: C73

## 2015-09-07 HISTORY — DX: Postprocedural hypothyroidism: E89.0

## 2015-10-02 ENCOUNTER — Other Ambulatory Visit: Payer: BLUE CROSS/BLUE SHIELD

## 2015-10-09 DIAGNOSIS — Z01419 Encounter for gynecological examination (general) (routine) without abnormal findings: Secondary | ICD-10-CM | POA: Diagnosis not present

## 2015-10-15 DIAGNOSIS — M2550 Pain in unspecified joint: Secondary | ICD-10-CM | POA: Diagnosis not present

## 2015-10-19 ENCOUNTER — Other Ambulatory Visit: Payer: BLUE CROSS/BLUE SHIELD

## 2015-11-15 DIAGNOSIS — C73 Malignant neoplasm of thyroid gland: Secondary | ICD-10-CM | POA: Diagnosis not present

## 2015-11-15 DIAGNOSIS — E890 Postprocedural hypothyroidism: Secondary | ICD-10-CM | POA: Diagnosis not present

## 2016-02-28 DIAGNOSIS — J4520 Mild intermittent asthma, uncomplicated: Secondary | ICD-10-CM | POA: Diagnosis not present

## 2016-02-28 DIAGNOSIS — J3089 Other allergic rhinitis: Secondary | ICD-10-CM | POA: Diagnosis not present

## 2016-02-28 DIAGNOSIS — J0100 Acute maxillary sinusitis, unspecified: Secondary | ICD-10-CM | POA: Diagnosis not present

## 2016-02-28 DIAGNOSIS — J301 Allergic rhinitis due to pollen: Secondary | ICD-10-CM | POA: Diagnosis not present

## 2016-02-29 DIAGNOSIS — C73 Malignant neoplasm of thyroid gland: Secondary | ICD-10-CM | POA: Diagnosis not present

## 2016-03-07 DIAGNOSIS — J3089 Other allergic rhinitis: Secondary | ICD-10-CM | POA: Diagnosis not present

## 2016-03-07 DIAGNOSIS — J3081 Allergic rhinitis due to animal (cat) (dog) hair and dander: Secondary | ICD-10-CM | POA: Diagnosis not present

## 2016-03-07 DIAGNOSIS — J301 Allergic rhinitis due to pollen: Secondary | ICD-10-CM | POA: Diagnosis not present

## 2016-04-01 ENCOUNTER — Ambulatory Visit: Payer: BLUE CROSS/BLUE SHIELD | Admitting: Internal Medicine

## 2016-04-07 DIAGNOSIS — C73 Malignant neoplasm of thyroid gland: Secondary | ICD-10-CM | POA: Diagnosis not present

## 2016-04-10 DIAGNOSIS — J1089 Influenza due to other identified influenza virus with other manifestations: Secondary | ICD-10-CM | POA: Diagnosis not present

## 2016-05-21 DIAGNOSIS — C73 Malignant neoplasm of thyroid gland: Secondary | ICD-10-CM | POA: Diagnosis not present

## 2016-07-18 ENCOUNTER — Encounter: Payer: Self-pay | Admitting: Family Medicine

## 2016-07-18 ENCOUNTER — Ambulatory Visit (INDEPENDENT_AMBULATORY_CARE_PROVIDER_SITE_OTHER): Payer: BLUE CROSS/BLUE SHIELD | Admitting: Family Medicine

## 2016-07-18 VITALS — BP 124/82 | HR 80 | Temp 98.0°F | Ht 68.0 in | Wt 179.4 lb

## 2016-07-18 DIAGNOSIS — E7800 Pure hypercholesterolemia, unspecified: Secondary | ICD-10-CM

## 2016-07-18 DIAGNOSIS — J301 Allergic rhinitis due to pollen: Secondary | ICD-10-CM

## 2016-07-18 DIAGNOSIS — F40243 Fear of flying: Secondary | ICD-10-CM | POA: Diagnosis not present

## 2016-07-18 DIAGNOSIS — E890 Postprocedural hypothyroidism: Secondary | ICD-10-CM

## 2016-07-18 DIAGNOSIS — M7701 Medial epicondylitis, right elbow: Secondary | ICD-10-CM

## 2016-07-18 DIAGNOSIS — Z85850 Personal history of malignant neoplasm of thyroid: Secondary | ICD-10-CM | POA: Diagnosis not present

## 2016-07-18 DIAGNOSIS — R600 Localized edema: Secondary | ICD-10-CM

## 2016-07-18 NOTE — Progress Notes (Signed)
Barbara Cardenas is a 46 y.o. female is here to [employer].   Patient Care Team: Leighton Ruff, MD as PCP - General (Family Medicine)   History of Present Illness:   Barbara Cardenas CMA acting as scribe for Dr. Juleen China.  CC: Patient comes in today to establish care. She is having some right ankle swelling and tenderness. Denies any injury with this. She is also having some right elbow discomfort. It is tender to the touch and some pain with motion.   HPI:  1. Right elbow pain. Onset of the symptoms was several weeks ago. Inciting event: hold her handbag. Current symptoms include: point tenderness medially. Pain is aggravated by: lifting heavy objects. Symptoms have gradually improved. Patient has had no prior elbow problems. Evaluation to date: none. Treatment to date: avoidance of offending activity.   2. Right ankle swelling. Intermittent, x several months. Worse at the end of the day. On her feet all day. No treatment. No prior injury to the ankle.   3. History of thyroid cancer. S/p thyroidectomy. Now, with resolution of most systemic complaints from before (joint pain, paresthesias, vision changes, anxiety).    4. Seasonal allergic rhinitis due to pollen. Uses Singulair, Xyzal, Qnasl, Pataday, Proair prn. No complaints today.   5. 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    Health Maintenance Due  Topic Date Due  . TETANUS/TDAP  11/05/1990   PMHx, SurgHx, SocialHx, Medications, and Allergies were reviewed in the Visit Navigator and updated as appropriate.   Past Medical History:  Diagnosis Date  . Cancer of thyroid ([place]) 09/07/2015  . 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 Hypercholesteremia 12/21/2013  . Neuropathy   . Postoperative hypothyroidism 09/07/2015  . Seasonal allergies 07/20/2016  . Thyroid cancer ([place])   . Vision abnormalities    Past Surgical History:  Procedure Laterality Date  . THYROIDECTOMY, PARTIAL     Family History  Problem Relation Age of Onset    . Healthy Mother   . CAD Father   . Allergies Father   . Ulcerative colitis Father   . Tremor Father   . Neuropathy Neg Hx    Social History  Substance Use Topics  . Smoking status: Never Smoker  . Smokeless tobacco: Never Used  . Alcohol use No   Current Medications and Allergies:   .  Adapalene-Benzoyl Peroxide (EPIDUO) 0.1-2.5 % gel, Apply topically at bedtime., Disp: , Rfl:  .  ALPRAZolam (XANAX) 0.5 MG tablet, Take 0.5 mg by mouth as needed for anxiety. Reported on 04/02/2015, Disp: , Rfl:  .  Beclomethasone Dipropionate (QNASL) 80 MCG/ACT AERS, Place into the nose., Disp: , Rfl:  .  Cholecalciferol (VITAMIN D PO), Take 4,000 Units by mouth., Disp: , Rfl:  .  levocetirizine (XYZAL) 5 MG tablet, Take 5 mg by mouth every evening., Disp: , Rfl:  .  montelukast (SINGULAIR) 10 MG tablet, Take 10 mg by mouth at bedtime. 1 tablet in the evening, Disp: , Rfl:  .  Multiple Vitamin (MULTI-VITAMINS) TABS, Take by mouth., Disp: , Rfl:   No Known Allergies   Review of Systems:   Review of Systems  Constitutional: Negative for chills, fever and malaise/fatigue.  HENT: Negative for ear pain and sinus pain.   Eyes: Negative for blurred vision and double vision.  Respiratory: Negative for cough and wheezing.   Cardiovascular: Negative for chest pain and palpitations.  Gastrointestinal: Negative for abdominal pain, constipation and vomiting.  Musculoskeletal: Positive for joint pain. Negative  for back pain and neck pain.  Neurological: Negative for dizziness and headaches.  Psychiatric/Behavioral: Negative for depression, hallucinations and memory loss.   Vitals:   Vitals:   07/18/16 0858  BP: 124/82  Pulse: 80  Temp: 98 F (36.7 C)  TempSrc: Oral  SpO2: 97%  Weight: 179 lb 6.4 oz (81.4 kg)  Height: 5\' 8"  (1.727 m)     Body mass index is 27.28 kg/m.  Physical Exam:   Physical Exam  Constitutional: She appears well-developed and well-nourished. No distress.  HENT:  Head:  Normocephalic and atraumatic.  Eyes: EOM are normal. Pupils are equal, round, and reactive to light.  Neck: Normal range of motion. Neck supple.  Cardiovascular: Normal rate, regular rhythm, normal heart sounds and intact distal pulses.   Pulmonary/Chest: Effort normal.  Abdominal: Soft.  Skin: Skin is warm.  Psychiatric: She has a normal mood and affect. Her behavior is normal.  Nursing note and vitals reviewed.   Assessment and Plan:   Barbara Cardenas was seen today for establish care.  Diagnoses and all orders for this visit:  Medial epicondylitis of right elbow Comments: New. Mild and already improving since changing handbag. If worsens, to [place].  Edema of soft tissue of right ankle region Comments: New. Minimal. Seems to be dependent edema. No red flags. Reviewed use of compression stockings.  History of thyroid cancer Comments: Most of her previous symptoms have improved since her surgery.  Hypercholesteremia Comments: No FLP on file. Will place future order.   Postoperative hypothyroidism Comments: Stable. Well controlled.  No signs of complications, medication side effects, or red flags.  Continue current regimen.    Seasonal allergic rhinitis due to pollen Comments: Stable. Controlled on current regimen.  Fear of flying Comments: Uses Xanax only when she has to fly.   . Reviewed expectations re: course of current medical issues. . Discussed self-management of symptoms. . Outlined signs and symptoms indicating need for more acute intervention. . Patient verbalized understanding and all questions were answered. Marland Kitchen Health Maintenance issues including appropriate healthy diet, exercise, and smoking avoidance were discussed with patient. . See orders for this visit as documented in the electronic medical record. . Patient received an After Visit Summary.  CMA served as Education administrator during this visit. History, Physical, and Plan performed by medical provider. The above  documentation has been reviewed and is accurate and complete. Briscoe Deutscher, D.O.  Records requested if needed. Time spent with the patient: 30 minutes, of which >50% was spent in obtaining information about her symptoms, reviewing her previous labs, evaluations, and treatments, counseling her about her condition (please see the discussed topics above), and developing a plan to further investigate it; she had a number of questions which I addressed. We reviewed all HM and active problems together.  Briscoe Deutscher, DO [place] 07/20/2016

## 2016-07-20 ENCOUNTER — Encounter: Payer: Self-pay | Admitting: Family Medicine

## 2016-07-20 DIAGNOSIS — F40243 Fear of flying: Secondary | ICD-10-CM

## 2016-07-20 DIAGNOSIS — J302 Other seasonal allergic rhinitis: Secondary | ICD-10-CM | POA: Insufficient documentation

## 2016-07-20 DIAGNOSIS — Z85850 Personal history of malignant neoplasm of thyroid: Secondary | ICD-10-CM | POA: Insufficient documentation

## 2016-07-20 HISTORY — DX: Fear of flying: F40.243

## 2016-07-20 HISTORY — DX: Other seasonal allergic rhinitis: J30.2

## 2016-08-25 DIAGNOSIS — E039 Hypothyroidism, unspecified: Secondary | ICD-10-CM | POA: Diagnosis not present

## 2016-08-25 DIAGNOSIS — N951 Menopausal and female climacteric states: Secondary | ICD-10-CM | POA: Diagnosis not present

## 2016-10-20 ENCOUNTER — Other Ambulatory Visit: Payer: Self-pay | Admitting: Obstetrics and Gynecology

## 2016-10-20 DIAGNOSIS — Z1231 Encounter for screening mammogram for malignant neoplasm of breast: Secondary | ICD-10-CM

## 2016-10-21 ENCOUNTER — Other Ambulatory Visit (HOSPITAL_COMMUNITY)
Admission: RE | Admit: 2016-10-21 | Discharge: 2016-10-21 | Disposition: A | Discharge status: Home / Self Care | Payer: BLUE CROSS/BLUE SHIELD | Source: Ambulatory Visit | Attending: Obstetrics and Gynecology | Admitting: Obstetrics and Gynecology

## 2016-10-21 ENCOUNTER — Other Ambulatory Visit: Payer: Self-pay | Admitting: Obstetrics and Gynecology

## 2016-10-21 DIAGNOSIS — Z01419 Encounter for gynecological examination (general) (routine) without abnormal findings: Secondary | ICD-10-CM | POA: Diagnosis not present

## 2016-10-22 LAB — CYTOLOGY - PAP: DIAGNOSIS: NEGATIVE

## 2016-10-30 ENCOUNTER — Ambulatory Visit
Admission: RE | Admit: 2016-10-30 | Discharge: 2016-10-30 | Disposition: A | Discharge status: Home / Self Care | Payer: BLUE CROSS/BLUE SHIELD | Source: Ambulatory Visit | Attending: Obstetrics and Gynecology | Admitting: Obstetrics and Gynecology

## 2016-10-30 DIAGNOSIS — Z1231 Encounter for screening mammogram for malignant neoplasm of breast: Secondary | ICD-10-CM

## 2016-11-03 DIAGNOSIS — E039 Hypothyroidism, unspecified: Secondary | ICD-10-CM | POA: Diagnosis not present

## 2016-12-02 ENCOUNTER — Encounter: Payer: Self-pay | Admitting: Family Medicine

## 2016-12-02 ENCOUNTER — Ambulatory Visit (INDEPENDENT_AMBULATORY_CARE_PROVIDER_SITE_OTHER): Payer: BLUE CROSS/BLUE SHIELD | Admitting: Family Medicine

## 2016-12-02 VITALS — BP 122/78 | HR 78 | Temp 98.4°F | Ht 68.0 in | Wt 184.0 lb

## 2016-12-02 DIAGNOSIS — Z136 Encounter for screening for cardiovascular disorders: Secondary | ICD-10-CM

## 2016-12-02 DIAGNOSIS — H6981 Other specified disorders of Eustachian tube, right ear: Secondary | ICD-10-CM | POA: Diagnosis not present

## 2016-12-02 DIAGNOSIS — H6991 Unspecified Eustachian tube disorder, right ear: Secondary | ICD-10-CM

## 2016-12-02 DIAGNOSIS — Z13220 Encounter for screening for lipoid disorders: Secondary | ICD-10-CM

## 2016-12-02 DIAGNOSIS — J302 Other seasonal allergic rhinitis: Secondary | ICD-10-CM | POA: Diagnosis not present

## 2016-12-02 MED ORDER — BECLOMETHASONE DIPROPIONATE 80 MCG/ACT NA AERS: INHALATION_SPRAY | NASAL | 11 refills | Status: DC

## 2016-12-02 NOTE — Progress Notes (Signed)
Barbara Cardenas is a 46 y.o. female here for an acute visit.  History of Present Illness:   Water quality scientist, CMA, acting as scribe for Dr. Juleen China.  Otalgia   There is pain in the right ear. This is a recurrent problem. The current episode started in the past 7 days. The problem has been gradually worsening. There has been no fever. The pain is mild. Associated symptoms include a sore throat. She has tried nothing for the symptoms.   PMHx, SurgHx, SocialHx, Medications, and Allergies were reviewed in the Visit Navigator and updated as appropriate.  Current Medications:   .  acetaminophen (TYLENOL) 160 mg/5 mL SOLN, Take by mouth., Disp: , Rfl:  .  albuterol (PROAIR HFA) 108 (90 Base) MCG/ACT inhaler, Reported on 06/29/2015-PRN, Disp: , Rfl:  .  Beclomethasone Dipropionate (QNASL) 80 MCG/ACT AERS, Place into the nose., Disp: , Rfl:  -NOT TAKING, WAITING ON PRIOR AUTHORIZATION .  Cholecalciferol (VITAMIN D PO), Take 4,000 Units by mouth., Disp: , Rfl:  .  levocetirizine (XYZAL) 5 MG tablet, Take 5 mg by mouth every evening., Disp: , Rfl:  .  levothyroxine (SYNTHROID, LEVOTHROID) 75 MCG tablet, Take 75 mcg by mouth daily before breakfast., Disp: , Rfl:  .  Multiple Vitamin (MULTI-VITAMINS) TABS, Take by mouth., Disp: , Rfl:  .  Olopatadine HCl (PATADAY) 0.2 % SOLN, INSTILL 1-2 DROPS INTO BOTH EYES DAILY, Disp: , Rfl:    No Known Allergies   Review of Systems:   Pertinent items are noted in the HPI. Otherwise, ROS is negative.  Vitals:   Vitals:   12/02/16 1325  BP: 122/78  Pulse: 78  Temp: 98.4 F (36.9 C)  TempSrc: Oral  SpO2: 97%  Weight: 184 lb (83.5 kg)  Height: 5\' 8"  (1.727 m)     Body mass index is 27.98 kg/m.   Physical Exam:   Physical Exam  Constitutional: She appears well-developed and well-nourished. No distress.  HENT:  Head: Normocephalic and atraumatic.  Right Ear: A middle ear effusion is present.  Eyes: Pupils are equal, round, and reactive to light.  EOM are normal.  Neck: Normal range of motion. Neck supple.  Cardiovascular: Normal rate, regular rhythm, normal heart sounds and intact distal pulses.   Pulmonary/Chest: Effort normal.  Abdominal: Soft.  Skin: Skin is warm.  Psychiatric: She has a normal mood and affect. Her behavior is normal.  Nursing note and vitals reviewed.  Assessment and Plan:   Barbara Cardenas was seen today for acute visit.  Diagnoses and all orders for this visit:  Dysfunction of right eustachian tube -     Beclomethasone Dipropionate 80 MCG/ACT AERS; PLACE INTO THE NOSE  Seasonal allergies -     Beclomethasone Dipropionate 80 MCG/ACT AERS; PLACE INTO THE NOSE  Encounter for lipid screening for cardiovascular disease -     Lipid panel; Future   . Reviewed expectations re: course of current medical issues. . Discussed self-management of symptoms. . Outlined signs and symptoms indicating need for more acute intervention. . Patient verbalized understanding and all questions were answered. Marland Kitchen Health Maintenance issues including appropriate healthy diet, exercise, and smoking avoidance were discussed with patient. . See orders for this visit as documented in the electronic medical record. . Patient received an After Visit Summary.  CMA served as Education administrator during this visit. History, Physical, and Plan performed by medical provider. The above documentation has been reviewed and is accurate and complete. Briscoe Deutscher, D.O.  Briscoe Deutscher, DO [place]  [place] 12/07/2016  Future Appointments Date Time Provider [place]  12/09/2016 8:45 AM [facility] None

## 2016-12-03 ENCOUNTER — Telehealth: Payer: Self-pay | Admitting: Family Medicine

## 2016-12-03 NOTE — Telephone Encounter (Signed)
Patient returning missed phone call. Informed patient of note below. Patient stated she has tried Flonase. Patient stated her insurance does this every year for getting PA. Please call patient and advise.

## 2016-12-03 NOTE — Telephone Encounter (Signed)
MEDICATION: Q Nasal Nose Spray  PHARMACY:   [id number] - [place], [place] - [address] [phone] (Phone) [phone] (Fax)   IS THIS A 90 DAY SUPPLY : yes  IS PATIENT OUT OF MEDICATION: yes  IF NOT; HOW MUCH IS LEFT:   LAST APPOINTMENT DATE: @10 /10/2016  NEXT APPOINTMENT DATE:@10 /16/2018  OTHER COMMENTS: Patient is having bad ear pain due to needing the nose spray. Call to advise.    **Let patient know to contact pharmacy at the end of the day to make sure medication is ready. **  ** Please notify patient to allow 48-72 hours to process**  **Encourage patient to contact the pharmacy for refills or they can request refills through [facility]**

## 2016-12-03 NOTE — Telephone Encounter (Signed)
PA is in progress for QNasl.

## 2016-12-03 NOTE — Telephone Encounter (Signed)
LM for patient to return call. The prescription is at the pharmacy but it needs a PA. Patient can call her insurance to see if there is something that they will cover or per pharmacy can try Flonase over the counter.

## 2016-12-04 NOTE — Telephone Encounter (Signed)
Received response from Cover My Meds that PA is already in process for this patient for this medication.  Unsure who is doing the PA.

## 2016-12-04 NOTE — Telephone Encounter (Signed)
Left message for patient that a PA had already been started from another provider. We are waiting for approval.

## 2016-12-05 NOTE — Telephone Encounter (Signed)
Spoke with patient and explained that due to the other office starting the PA that we can't start another. I explained that even if we wrote a new RX that it can not be started. Patient verbalized understanding and thanked me for the information. She will call her other doctor to check on the PA.

## 2016-12-05 NOTE — Telephone Encounter (Signed)
Patient stated that "that is not going to work." The other provider was from her allergist and she can not get in to see the allergist until late November. Patient stated she spoke to [facility] and they need to talk to Dr. Juleen China or clinical team and tell them that it is a medical necessity so that they can get it filled within 24 hours. Call [phone] [facility]). This is the fastest way to get this approved. Patient has used other nasal drops.

## 2016-12-07 DIAGNOSIS — H6981 Other specified disorders of Eustachian tube, right ear: Secondary | ICD-10-CM | POA: Insufficient documentation

## 2016-12-07 DIAGNOSIS — H6991 Unspecified Eustachian tube disorder, right ear: Secondary | ICD-10-CM | POA: Insufficient documentation

## 2016-12-09 ENCOUNTER — Other Ambulatory Visit: Payer: BLUE CROSS/BLUE SHIELD

## 2016-12-16 ENCOUNTER — Other Ambulatory Visit (INDEPENDENT_AMBULATORY_CARE_PROVIDER_SITE_OTHER): Payer: BLUE CROSS/BLUE SHIELD

## 2016-12-16 DIAGNOSIS — Z13220 Encounter for screening for lipoid disorders: Secondary | ICD-10-CM | POA: Diagnosis not present

## 2016-12-16 DIAGNOSIS — Z136 Encounter for screening for cardiovascular disorders: Secondary | ICD-10-CM

## 2016-12-16 LAB — LIPID PANEL
Cholesterol: 211 mg/dL — ABNORMAL HIGH (ref 0–200)
HDL: 59.8 mg/dL (ref >39.00)
LDL Cholesterol: 124 mg/dL — ABNORMAL HIGH (ref 0–99)
NonHDL: 150.86
Total CHOL/HDL Ratio: 4
Triglycerides: 133 mg/dL (ref 0.0–149.0)
VLDL: 26.6 mg/dL (ref 0.0–40.0)

## 2017-01-09 DIAGNOSIS — L700 Acne vulgaris: Secondary | ICD-10-CM | POA: Diagnosis not present

## 2017-03-06 DIAGNOSIS — C73 Malignant neoplasm of thyroid gland: Secondary | ICD-10-CM | POA: Diagnosis not present

## 2017-04-14 ENCOUNTER — Ambulatory Visit: Payer: BLUE CROSS/BLUE SHIELD | Admitting: Family Medicine

## 2017-04-14 ENCOUNTER — Encounter: Payer: Self-pay | Admitting: Family Medicine

## 2017-04-14 VITALS — BP 140/80 | HR 68 | Temp 98.6°F | Wt 184.8 lb

## 2017-04-14 DIAGNOSIS — J0100 Acute maxillary sinusitis, unspecified: Secondary | ICD-10-CM

## 2017-04-14 DIAGNOSIS — F40243 Fear of flying: Secondary | ICD-10-CM

## 2017-04-14 MED ORDER — ALPRAZOLAM 0.5 MG PO TABS: 0.5000 mg | ORAL_TABLET | ORAL | 0 refills | Status: DC | PRN

## 2017-04-14 MED ORDER — PREDNISONE 5 MG PO TABS: ORAL_TABLET | ORAL | 0 refills | Status: DC

## 2017-04-14 MED ORDER — AMOXICILLIN 875 MG PO TABS: 875.0000 mg | ORAL_TABLET | Freq: Two times a day (BID) | ORAL | 0 refills | Status: DC

## 2017-04-14 NOTE — Progress Notes (Signed)
Barbara Cardenas is a 47 y.o. female here for an acute visit.  History of Present Illness:   Lonell Grandchild, CMA acting as scribe for Dr. Briscoe Deutscher.   Cough  This is a new problem. The current episode started in the past 7 days. The problem has been gradually worsening. The problem occurs constantly. The cough is productive of sputum. Associated symptoms include ear congestion, ear pain, headaches, nasal congestion and postnasal drip. Pertinent negatives include no fever. She has tried OTC cough suppressant and steroid inhaler for the symptoms. The treatment provided mild relief.   PMHx, SurgHx, SocialHx, Medications, and Allergies were reviewed in the Visit Navigator and updated as appropriate.  Current Medications:   Current Outpatient Medications:  .  acetaminophen (TYLENOL) 160 mg/5 mL SOLN, Take by mouth., Disp: , Rfl:  .  albuterol (PROAIR HFA) 108 (90 Base) MCG/ACT inhaler, Reported on 06/29/2015-PRN, Disp: , Rfl:  .  Beclomethasone Dipropionate 80 MCG/ACT AERS, PLACE INTO THE NOSE, Disp: 8.7 g, Rfl: 11 .  Cholecalciferol (VITAMIN D PO), Take 4,000 Units by mouth., Disp: , Rfl:  .  levocetirizine (XYZAL) 5 MG tablet, Take 5 mg by mouth every evening., Disp: , Rfl:  .  levothyroxine (SYNTHROID, LEVOTHROID) 75 MCG tablet, Take 75 mcg by mouth daily before breakfast., Disp: , Rfl:  .  Multiple Vitamin (MULTI-VITAMINS) TABS, Take by mouth., Disp: , Rfl:  .  Olopatadine HCl (PATADAY) 0.2 % SOLN, INSTILL 1-2 DROPS INTO BOTH EYES DAILY, Disp: , Rfl:    No Known Allergies   Review of Systems:   Pertinent items are noted in the HPI. Otherwise, ROS is negative.  Vitals:   Vitals:   04/14/17 0834  BP: 140/80  Pulse: 68  Temp: 98.6 F (37 C)  TempSrc: Oral  SpO2: 97%  Weight: 184 lb 12.8 oz (83.8 kg)     Body mass index is 28.1 kg/m.  Physical Exam:   Physical Exam  Constitutional: She appears well-nourished.  HENT:  Head: Normocephalic and atraumatic.  Nose:  Mucosal edema and rhinorrhea present. Right sinus exhibits maxillary sinus tenderness. Left sinus exhibits maxillary sinus tenderness.  Eyes: EOM are normal. Pupils are equal, round, and reactive to light.  Neck: Normal range of motion. Neck supple.  Cardiovascular: Normal rate, regular rhythm, normal heart sounds and intact distal pulses.  Pulmonary/Chest: Effort normal.  Abdominal: Soft.  Skin: Skin is warm.  Psychiatric: She has a normal mood and affect. Her behavior is normal.  Nursing note and vitals reviewed.   Assessment and Plan:   1. Subacute maxillary sinusitis - predniSONE (DELTASONE) 5 MG tablet; 6-5-4-3-2-1-off  Dispense: 21 tablet; Refill: 0 - amoxicillin (AMOXIL) 875 MG tablet; Take 1 tablet (875 mg total) by mouth 2 (two) times daily.  Dispense: 20 tablet; Refill: 0  2. Fear of flying - ALPRAZolam (XANAX) 0.5 MG tablet; Take 1 tablet (0.5 mg total) by mouth as needed for anxiety. Reported on 04/02/2015  Dispense: 30 tablet; Refill: 0   . Reviewed expectations re: course of current medical issues. . Discussed self-management of symptoms. . Outlined signs and symptoms indicating need for more acute intervention. . Patient verbalized understanding and all questions were answered. Marland Kitchen Health Maintenance issues including appropriate healthy diet, exercise, and smoking avoidance were discussed with patient. . See orders for this visit as documented in the electronic medical record. . Patient received an After Visit Summary.  CMA served as Education administrator during this visit. History, Physical, and Plan performed by medical provider.  The above documentation has been reviewed and is accurate and complete. Briscoe Deutscher, D.O.  Briscoe Deutscher, DO [place] 04/14/2017

## 2017-05-24 DIAGNOSIS — M542 Cervicalgia: Secondary | ICD-10-CM | POA: Diagnosis not present

## 2017-05-24 DIAGNOSIS — R9431 Abnormal electrocardiogram [ECG] [EKG]: Secondary | ICD-10-CM | POA: Diagnosis not present

## 2017-06-01 ENCOUNTER — Ambulatory Visit: Payer: BLUE CROSS/BLUE SHIELD

## 2017-06-02 DIAGNOSIS — J301 Allergic rhinitis due to pollen: Secondary | ICD-10-CM | POA: Diagnosis not present

## 2017-06-02 DIAGNOSIS — J3089 Other allergic rhinitis: Secondary | ICD-10-CM | POA: Diagnosis not present

## 2017-06-02 DIAGNOSIS — J4520 Mild intermittent asthma, uncomplicated: Secondary | ICD-10-CM | POA: Diagnosis not present

## 2017-06-02 DIAGNOSIS — J3081 Allergic rhinitis due to animal (cat) (dog) hair and dander: Secondary | ICD-10-CM | POA: Diagnosis not present

## 2017-06-09 ENCOUNTER — Ambulatory Visit: Payer: BLUE CROSS/BLUE SHIELD

## 2017-06-26 DIAGNOSIS — L700 Acne vulgaris: Secondary | ICD-10-CM | POA: Diagnosis not present

## 2017-07-28 ENCOUNTER — Encounter: Payer: BLUE CROSS/BLUE SHIELD | Admitting: Family Medicine

## 2017-08-13 NOTE — Progress Notes (Signed)
Subjective:    Barbara Cardenas is a 47 y.o. female and is here for a comprehensive physical exam.  There are no preventive care reminders to display for this patient.   Current Outpatient Medications:  .  acetaminophen (TYLENOL) 160 mg/5 mL SOLN, Take by mouth., Disp: , Rfl:  .  albuterol (PROAIR HFA) 108 (90 Base) MCG/ACT inhaler, Reported on 06/29/2015-PRN, Disp: , Rfl:  .  ALPRAZolam (XANAX) 0.5 MG tablet, Take 1 tablet (0.5 mg total) by mouth as needed for anxiety. Reported on 04/02/2015, Disp: 30 tablet, Rfl: 0 .  Beclomethasone Dipropionate 80 MCG/ACT AERS, PLACE INTO THE NOSE, Disp: 8.7 g, Rfl: 11 .  levocetirizine (XYZAL) 5 MG tablet, Take 5 mg by mouth every evening., Disp: , Rfl:  .  levothyroxine (SYNTHROID, LEVOTHROID) 75 MCG tablet, Take 75 mcg by mouth daily before breakfast., Disp: , Rfl:  .  Olopatadine HCl (PATADAY) 0.2 % SOLN, INSTILL 1-2 DROPS INTO BOTH EYES DAILY, Disp: , Rfl:  .  clindamycin-benzoyl peroxide (BENZACLIN) gel, APPLY TO AFFECTED AREA ON FACE IN MORNING, Disp: , Rfl: 3 .  doxycycline (VIBRAMYCIN) 100 MG capsule, TAKE 1 CAPSULE BY MOUTH DAILY AS DIRECTED WITH FOOD, Disp: , Rfl: 3  PMHx, SurgHx, SocialHx, Medications, and Allergies were reviewed in the Visit Navigator and updated as appropriate.   Past Medical History:  Diagnosis Date  . Cancer of thyroid ([place]) 09/07/2015  . 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 Hypercholesteremia 12/21/2013  . Neuropathy   . Postoperative hypothyroidism 09/07/2015  . Seasonal allergies 07/20/2016  . Thyroid cancer ([place])   . Vision abnormalities     Past Surgical History:  Procedure Laterality Date  . THYROIDECTOMY, PARTIAL      Family History  Problem Relation Age of Onset  . Healthy Mother   . CAD Father   . Allergies Father   . Ulcerative colitis Father   . Tremor Father   . Neuropathy Neg Hx    Social History   Tobacco Use  . Smoking status: Never Smoker  . Smokeless tobacco: Never Used  Substance Use  Topics  . Alcohol use: No  . Drug use: No    Review of Systems:   Pertinent items are noted in the HPI. Otherwise, ROS is negative.  Objective:   BP 118/72   Pulse 88   Temp 98.1 F (36.7 C) (Oral)   Ht 5\' 8"  (1.727 m)   Wt 178 lb 3.2 oz (80.8 kg)   SpO2 98%   BMI 27.10 kg/m   General appearance: alert, cooperative and appears stated age. Head: normocephalic, without obvious abnormality, atraumatic. Neck: no adenopathy, supple, symmetrical, trachea midline; thyroid not enlarged, symmetric, no tenderness/mass/nodules. Lungs: clear to auscultation bilaterally. Heart: regular rate and rhythm Abdomen: soft, non-tender; no masses,  no organomegaly. Extremities: extremities normal, atraumatic, no cyanosis or edema. Skin: skin color, texture, turgor normal, no rashes or lesions. Lymph: cervical, supraclavicular, and axillary nodes normal; no abnormal inguinal nodes palpated. Neurologic: grossly normal.                                      Assessment/Plan:   Barbara Cardenas was seen today for annual exam.  Diagnoses and all orders for this visit:  Routine physical examination  Hypercholesteremia -     Lipid panel  Hyperglycemia -     Comprehensive metabolic panel -     POCT glycosylated hemoglobin (Hb  A1C)  Need for Tdap vaccination -     Tdap vaccine greater than or equal to 7yo IM  Immunity status testing -     Measles/Mumps/Rubella Immunity  Connective tissue disease, undifferentiated ([place])    Patient Counseling: [x]    Nutrition: Stressed importance of moderation in sodium/caffeine intake, saturated fat and cholesterol, caloric balance, sufficient intake of fresh fruits, vegetables, fiber, calcium, iron, and 1 mg of folate supplement per day (for females capable of pregnancy).  [x]    Stressed the importance of regular exercise.   [x]    Substance Abuse: Discussed cessation/primary prevention of tobacco, alcohol, or other drug use; driving or other dangerous activities  under the influence; availability of treatment for abuse.   [x]    Injury prevention: Discussed safety belts, safety helmets, smoke detector, smoking near bedding or upholstery.   [x]    Sexuality: Discussed sexually transmitted diseases, partner selection, use of condoms, avoidance of unintended pregnancy  and contraceptive alternatives.  [x]    Dental health: Discussed importance of regular tooth brushing, flossing, and dental visits.  [x]    Health maintenance and immunizations reviewed. Please refer to Health maintenance section.   Briscoe Deutscher, DO [place]

## 2017-08-14 ENCOUNTER — Ambulatory Visit (INDEPENDENT_AMBULATORY_CARE_PROVIDER_SITE_OTHER): Payer: BLUE CROSS/BLUE SHIELD | Admitting: Family Medicine

## 2017-08-14 ENCOUNTER — Encounter: Payer: Self-pay | Admitting: Family Medicine

## 2017-08-14 VITALS — BP 118/72 | HR 88 | Temp 98.1°F | Ht 68.0 in | Wt 178.2 lb

## 2017-08-14 DIAGNOSIS — R739 Hyperglycemia, unspecified: Secondary | ICD-10-CM | POA: Diagnosis not present

## 2017-08-14 DIAGNOSIS — Z0184 Encounter for antibody response examination: Secondary | ICD-10-CM

## 2017-08-14 DIAGNOSIS — Z23 Encounter for immunization: Secondary | ICD-10-CM | POA: Diagnosis not present

## 2017-08-14 DIAGNOSIS — M359 Systemic involvement of connective tissue, unspecified: Secondary | ICD-10-CM | POA: Diagnosis not present

## 2017-08-14 DIAGNOSIS — E7800 Pure hypercholesterolemia, unspecified: Secondary | ICD-10-CM

## 2017-08-14 DIAGNOSIS — Z0000 Encounter for general adult medical examination without abnormal findings: Secondary | ICD-10-CM | POA: Diagnosis not present

## 2017-08-14 LAB — LIPID PANEL
Cholesterol: 195 mg/dL (ref 0–200)
HDL: 60.1 mg/dL (ref >39.00)
LDL Cholesterol: 115 mg/dL — ABNORMAL HIGH (ref 0–99)
NonHDL: 135.27
Total CHOL/HDL Ratio: 3
Triglycerides: 102 mg/dL (ref 0.0–149.0)
VLDL: 20.4 mg/dL (ref 0.0–40.0)

## 2017-08-14 LAB — COMPREHENSIVE METABOLIC PANEL
ALT: 12 U/L (ref 0–35)
AST: 14 U/L (ref 0–37)
Albumin: 4.5 g/dL (ref 3.5–5.2)
Alkaline Phosphatase: 81 U/L (ref 39–117)
BUN: 14 mg/dL (ref 6–23)
CO2: 27 mEq/L (ref 19–32)
Calcium: 9.6 mg/dL (ref 8.4–10.5)
Chloride: 107 mEq/L (ref 96–112)
Creatinine, Ser: 0.72 mg/dL (ref 0.40–1.20)
GFR: 92.78 mL/min (ref >60.00)
Glucose, Bld: 93 mg/dL (ref 70–99)
Potassium: 3.6 mEq/L (ref 3.5–5.1)
Sodium: 141 mEq/L (ref 135–145)
Total Bilirubin: 0.5 mg/dL (ref 0.2–1.2)
Total Protein: 7.5 g/dL (ref 6.0–8.3)

## 2017-08-14 LAB — POCT GLYCOSYLATED HEMOGLOBIN (HGB A1C): Hemoglobin A1C: 5.3 % (ref 4.0–5.6)

## 2017-08-14 NOTE — Patient Instructions (Signed)
[place] ATTENTION SPECIALIST

## 2017-08-16 ENCOUNTER — Encounter: Payer: Self-pay | Admitting: Family Medicine

## 2017-08-16 DIAGNOSIS — M359 Systemic involvement of connective tissue, unspecified: Secondary | ICD-10-CM | POA: Insufficient documentation

## 2017-08-17 LAB — MEASLES/MUMPS/RUBELLA IMMUNITY
Mumps IgG: 29.2 AU/mL
Rubella: 1.73 index
Rubeola IgG: 38.9 AU/mL

## 2017-10-04 DIAGNOSIS — R600 Localized edema: Secondary | ICD-10-CM | POA: Diagnosis not present

## 2017-10-06 ENCOUNTER — Encounter: Payer: Self-pay | Admitting: Family Medicine

## 2017-10-06 ENCOUNTER — Ambulatory Visit (INDEPENDENT_AMBULATORY_CARE_PROVIDER_SITE_OTHER): Payer: BLUE CROSS/BLUE SHIELD | Admitting: Family Medicine

## 2017-10-06 VITALS — BP 126/88 | HR 84 | Temp 98.1°F | Ht 68.0 in | Wt 186.4 lb

## 2017-10-06 DIAGNOSIS — I10 Essential (primary) hypertension: Secondary | ICD-10-CM | POA: Diagnosis not present

## 2017-10-06 MED ORDER — HYDROCHLOROTHIAZIDE 12.5 MG PO CAPS: 12.5000 mg | ORAL_CAPSULE | Freq: Every day | ORAL | 0 refills | Status: DC

## 2017-10-06 NOTE — Progress Notes (Signed)
Barbara Cardenas is a 47 y.o. female is here for follow up.  History of Present Illness:   HPI: Bilateral lower extremity edema, mild but worsening. Exacerbated by being on feet through the day. Better with elevation. No CP, SOB, rash, HA, dizziness.  BP Readings from Last 3 Encounters:  10/06/17 126/88  08/14/17 118/72  04/14/17 140/80   Health Maintenance Due  Topic Date Due  . INFLUENZA VACCINE  09/24/2017   Depression screen PHQ 2/9 12/02/2016  Decreased Interest 0  Down, Depressed, Hopeless 0  PHQ - 2 Score 0   PMHx, SurgHx, SocialHx, FamHx, Medications, and Allergies were reviewed in the Visit Navigator and updated as appropriate.   Patient Active Problem List   Diagnosis Date Noted  . Connective tissue disease, undifferentiated ([place]) 08/16/2017  . Dysfunction of right eustachian tube 12/07/2016  . History of thyroid cancer 07/20/2016  . Seasonal allergies 07/20/2016  . 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 Postoperative hypothyroidism 09/07/2015  . Hypercholesteremia 12/21/2013   Social History   Tobacco Use  . Smoking status: Never Smoker  . Smokeless tobacco: Never Used  Substance Use Topics  . Alcohol use: No  . Drug use: No   Current Medications and Allergies:   .  acetaminophen (TYLENOL) 160 mg/5 mL SOLN, Take by mouth., Disp: , Rfl:  .  albuterol (PROAIR HFA) 108 (90 Base) MCG/ACT inhaler, Reported on 06/29/2015-PRN, Disp: , Rfl:  .  ALPRAZolam (XANAX) 0.5 MG tablet, Take 1 tablet (0.5 mg total) by mouth as needed for anxiety. Reported on 04/02/2015, Disp: 30 tablet, Rfl: 0 .  Beclomethasone Dipropionate 80 MCG/ACT AERS, PLACE INTO THE NOSE, Disp: 8.7 g, Rfl: 11 .  clindamycin-benzoyl peroxide (BENZACLIN) gel, APPLY TO AFFECTED AREA ON FACE IN MORNING, Disp: , Rfl: 3 .  levocetirizine (XYZAL) 5 MG tablet, Take 5 mg by mouth every evening., Disp: , Rfl:  .  levothyroxine (SYNTHROID, LEVOTHROID) 75 MCG tablet, Take 75 mcg by mouth daily before breakfast., Disp: ,  Rfl:  .  Olopatadine HCl (PATADAY) 0.2 % SOLN, INSTILL 1-2 DROPS INTO BOTH EYES DAILY, Disp: , Rfl:   No Known Allergies   Review of Systems   Pertinent items are noted in the HPI. Otherwise, ROS is negative.  Vitals:   Vitals:   10/06/17 1546  BP: 126/88  Pulse: 84  Temp: 98.1 F (36.7 C)  TempSrc: Oral  SpO2: 98%  Weight: 186 lb 6.4 oz (84.6 kg)  Height: 5\' 8"  (1.727 m)     Body mass index is 28.34 kg/m.  Physical Exam:   Physical Exam  Constitutional: She appears well-nourished.  HENT:  Head: Normocephalic and atraumatic.  Eyes: Pupils are equal, round, and reactive to light. EOM are normal.  Neck: Normal range of motion. Neck supple.  Cardiovascular: Normal rate, regular rhythm, normal heart sounds and intact distal pulses.  Pulmonary/Chest: Effort normal.  Abdominal: Soft.  Musculoskeletal: She exhibits edema.  Skin: Skin is warm.  Psychiatric: She has a normal mood and affect. Her behavior is normal.  Nursing note and vitals reviewed.   Assessment and Plan:   Barbara Cardenas was seen today for edema.  Diagnoses and all orders for this visit:  Essential hypertension -     Comprehensive metabolic panel -     hydrochlorothiazide (MICROZIDE) 12.5 MG capsule; Take 1 capsule (12.5 mg total) by mouth daily.    . Reviewed expectations re: course of current medical issues. . Discussed self-management of symptoms. . Outlined signs and symptoms  indicating need for more acute intervention. . Patient verbalized understanding and all questions were answered. Marland Kitchen Health Maintenance issues including appropriate healthy diet, exercise, and smoking avoidance were discussed with patient. . See orders for this visit as documented in the electronic medical record. . Patient received an After Visit Summary.   Briscoe Deutscher, DO [place], [facility] 10/19/2017

## 2017-10-07 LAB — COMPREHENSIVE METABOLIC PANEL
ALT: 16 U/L (ref 0–35)
AST: 17 U/L (ref 0–37)
Albumin: 4.2 g/dL (ref 3.5–5.2)
Alkaline Phosphatase: 86 U/L (ref 39–117)
BUN: 15 mg/dL (ref 6–23)
CO2: 26 mEq/L (ref 19–32)
Calcium: 10 mg/dL (ref 8.4–10.5)
Chloride: 106 mEq/L (ref 96–112)
Creatinine, Ser: 0.71 mg/dL (ref 0.40–1.20)
GFR: 94.23 mL/min (ref >60.00)
Glucose, Bld: 79 mg/dL (ref 70–99)
Potassium: 4.2 mEq/L (ref 3.5–5.1)
Sodium: 141 mEq/L (ref 135–145)
Total Bilirubin: 0.4 mg/dL (ref 0.2–1.2)
Total Protein: 7.3 g/dL (ref 6.0–8.3)

## 2017-10-19 ENCOUNTER — Encounter: Payer: Self-pay | Admitting: Family Medicine

## 2017-10-30 ENCOUNTER — Other Ambulatory Visit: Payer: Self-pay | Admitting: Family Medicine

## 2017-10-30 DIAGNOSIS — I10 Essential (primary) hypertension: Secondary | ICD-10-CM

## 2017-10-30 NOTE — Telephone Encounter (Signed)
Please advise. Patient does not have a month follow up schedule.

## 2017-11-05 ENCOUNTER — Other Ambulatory Visit: Payer: Self-pay | Admitting: Family Medicine

## 2017-11-05 DIAGNOSIS — I10 Essential (primary) hypertension: Secondary | ICD-10-CM

## 2017-11-13 ENCOUNTER — Other Ambulatory Visit: Payer: Self-pay | Admitting: Obstetrics and Gynecology

## 2017-11-13 DIAGNOSIS — Z1231 Encounter for screening mammogram for malignant neoplasm of breast: Secondary | ICD-10-CM

## 2017-11-17 ENCOUNTER — Other Ambulatory Visit: Payer: Self-pay

## 2017-11-17 DIAGNOSIS — I10 Essential (primary) hypertension: Secondary | ICD-10-CM

## 2017-11-17 MED ORDER — HYDROCHLOROTHIAZIDE 12.5 MG PO CAPS: ORAL_CAPSULE | ORAL | 0 refills | Status: DC

## 2017-12-17 ENCOUNTER — Ambulatory Visit
Admission: RE | Admit: 2017-12-17 | Discharge: 2017-12-17 | Disposition: A | Discharge status: Home / Self Care | Payer: BLUE CROSS/BLUE SHIELD | Source: Ambulatory Visit | Attending: Obstetrics and Gynecology | Admitting: Obstetrics and Gynecology

## 2017-12-17 DIAGNOSIS — Z1231 Encounter for screening mammogram for malignant neoplasm of breast: Secondary | ICD-10-CM | POA: Diagnosis not present

## 2018-01-04 IMAGING — US US THYROID BIOPSY
1 series · 13 of 16 positions shown · non-contrast
Comparison: none

INDICATION: 44-year-old with thyroid nodules.

[Series 1: us thyroid biopsy · 0.05mm/px · 16 acquisitions, 13 frames shown]
[im 1/16]
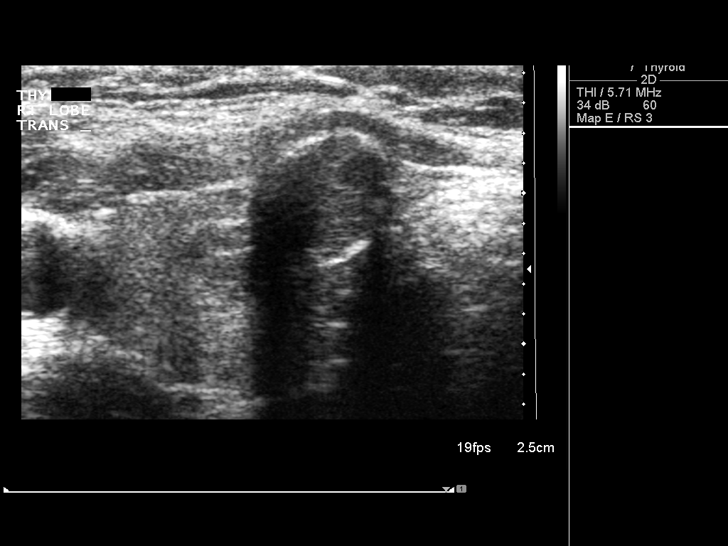
[im 2/16]
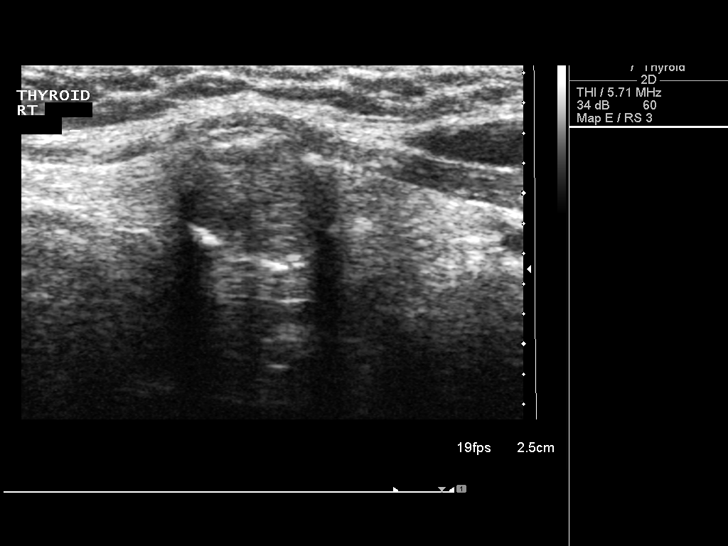
[im 4/16]
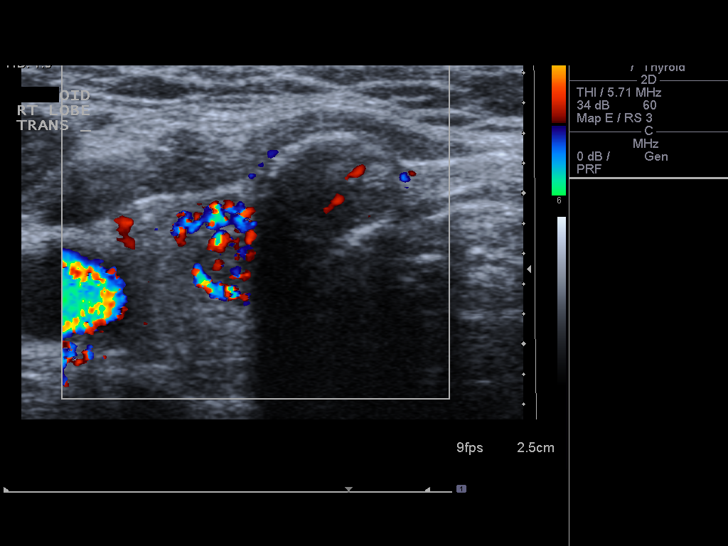
[im 5/16]
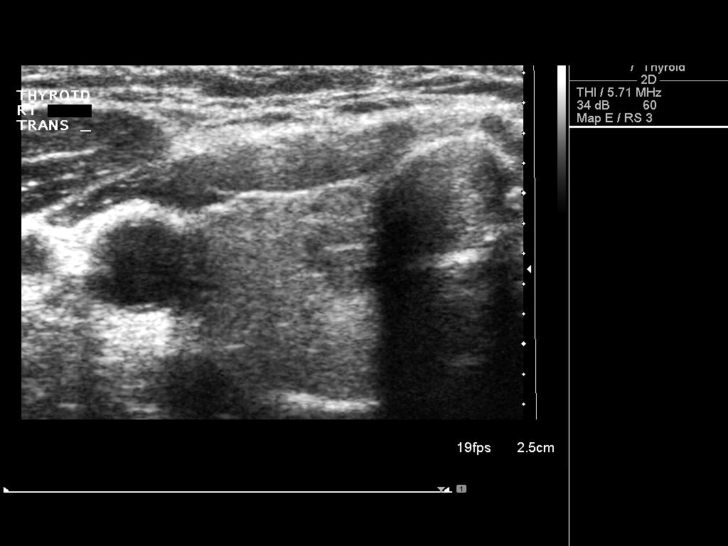
[im 6/16]
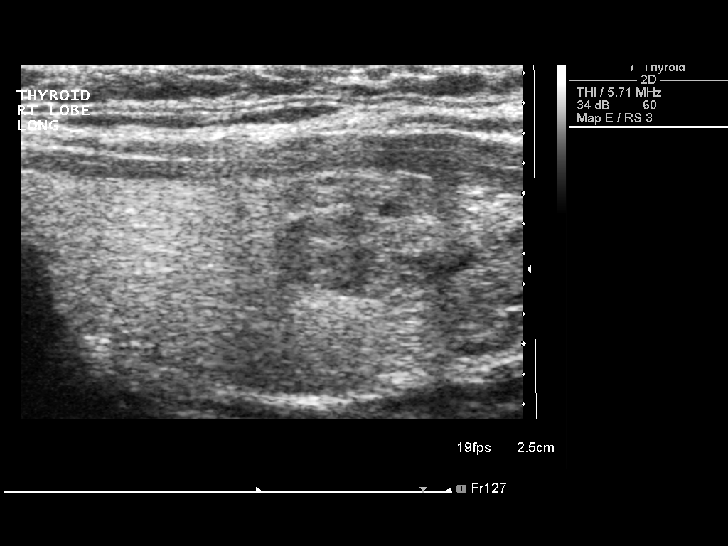
[im 7/16]
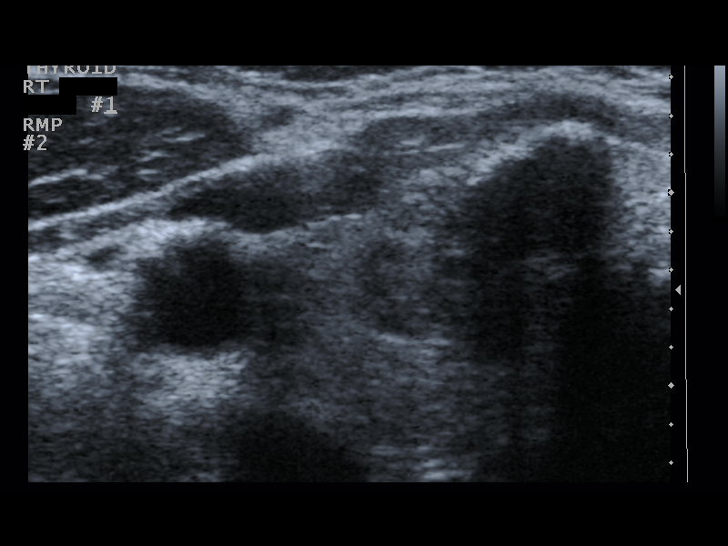
[im 9/16]
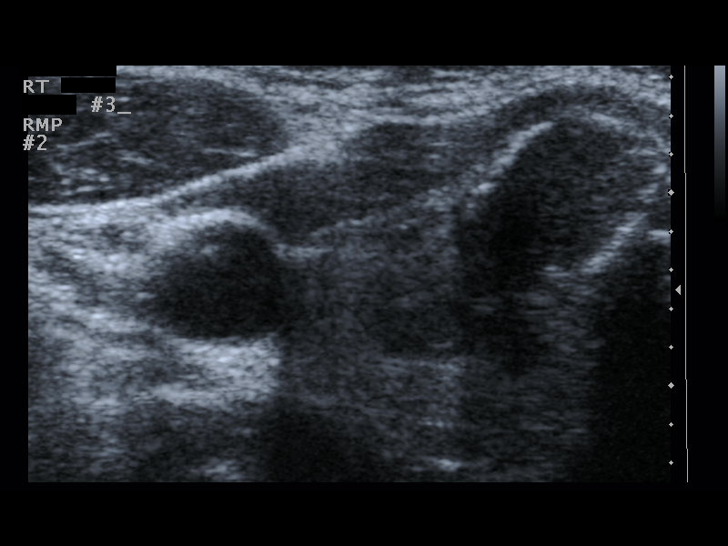
[im 10/16]
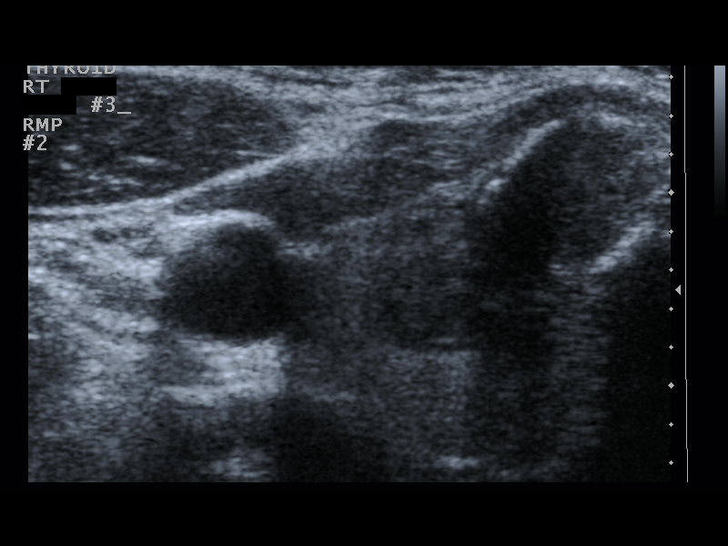
[im 11/16]
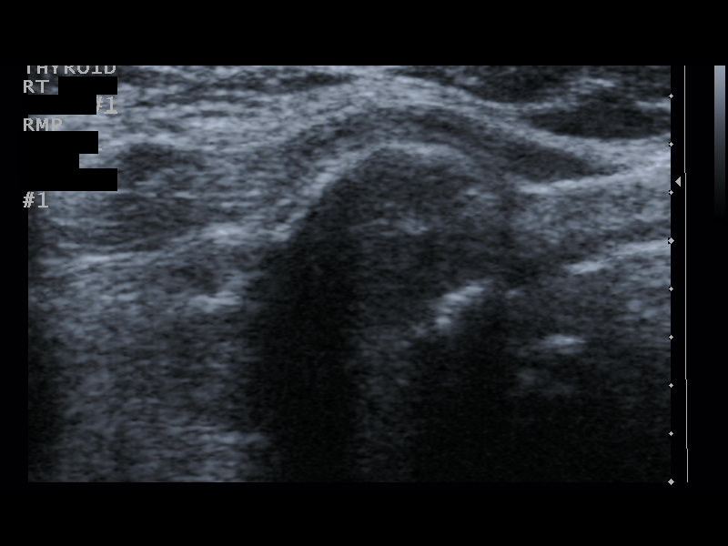
[im 12/16]
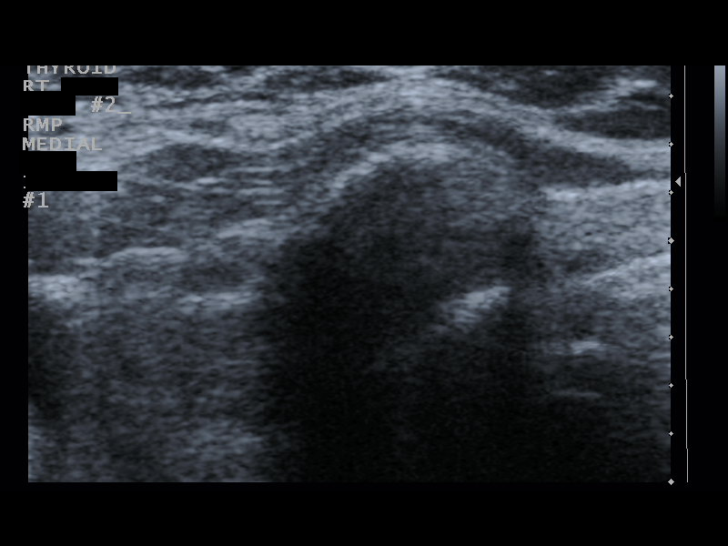
[im 13/16]
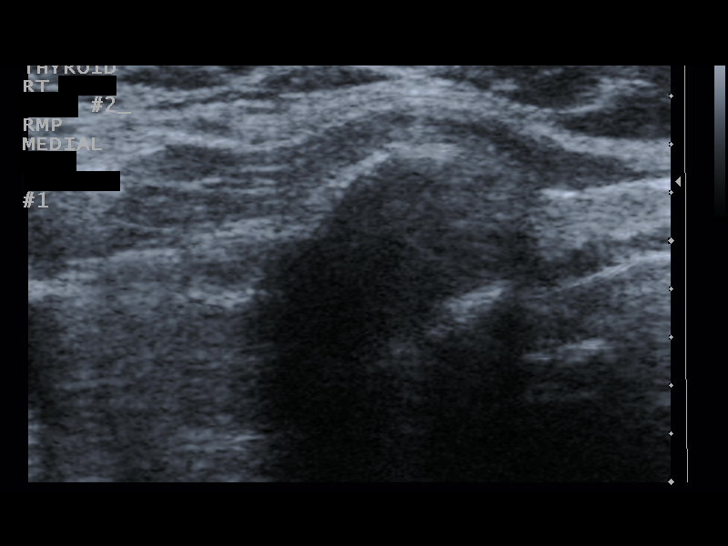
[im 15/16]
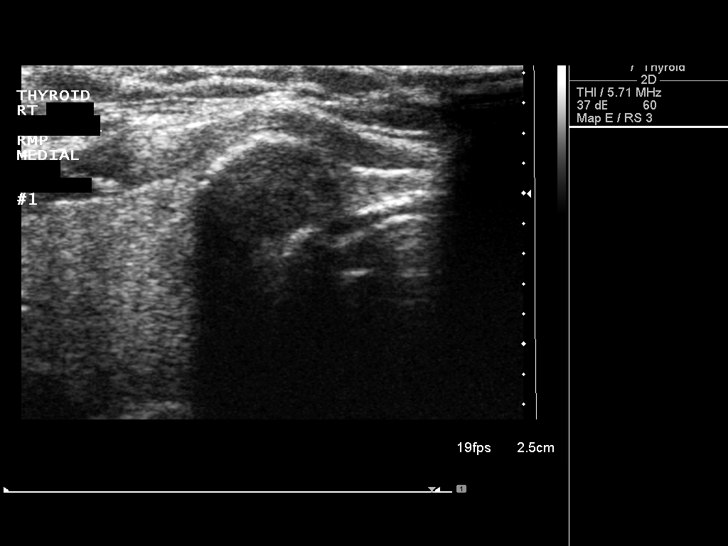
[im 16/16]
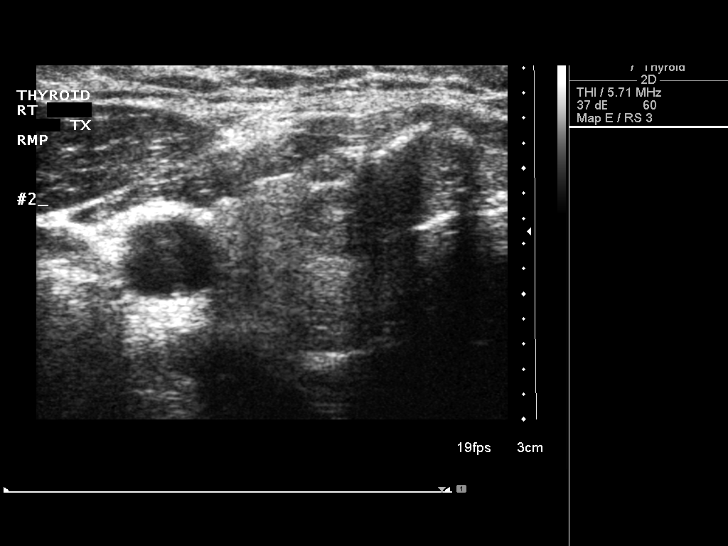

[13 of 16 positions shown; findings below may reference images not displayed]

EXAM:
ULTRASOUND GUIDED FINE NEEDLE ASPIRATION OF RIGHT THYROID NODULE X 2

MEDICATIONS:
None.

ANESTHESIA/SEDATION:
None

COMPLICATIONS:
None immediate.

PROCEDURE:
The procedure was explained to the patient. The risks and benefits
of the procedure were discussed and the patient's questions were
addressed. Informed consent was obtained from the patient. The neck
was evaluated with ultrasound. The neck was prepped with Betadine
and a sterile drape was placed. Right side of the neck was
anesthetized with 1% lidocaine. Using ultrasound guidance, 3
fine-needle aspirations were obtained within the right mid pole
nodule with 25 gauge needles. Using ultrasound guidance, 3
fine-needle aspirations were obtained within the peripherally
calcified nodule along the medial aspect of the right thyroid lobe.
Bandage placed over the puncture site.
FINDINGS: There is a peripherally calcified nodule along the medial aspect of
the right thyroid lobe. There are probably 2 small nodules adjacent
to this peripherally calcified lesion. The largest of these adjacent
lesions is just lateral to the calcified lesion and this nodule was
sampled.
IMPRESSION: Ultrasound-guided fine needle aspiration of two right thyroid
nodules.

## 2018-01-11 ENCOUNTER — Other Ambulatory Visit: Payer: Self-pay | Admitting: Obstetrics and Gynecology

## 2018-01-11 ENCOUNTER — Other Ambulatory Visit (HOSPITAL_COMMUNITY)
Admission: RE | Admit: 2018-01-11 | Discharge: 2018-01-11 | Disposition: A | Discharge status: Home / Self Care | Payer: BLUE CROSS/BLUE SHIELD | Source: Ambulatory Visit | Attending: Obstetrics and Gynecology | Admitting: Obstetrics and Gynecology

## 2018-01-11 DIAGNOSIS — Z01411 Encounter for gynecological examination (general) (routine) with abnormal findings: Secondary | ICD-10-CM | POA: Insufficient documentation

## 2018-01-11 DIAGNOSIS — Z01419 Encounter for gynecological examination (general) (routine) without abnormal findings: Secondary | ICD-10-CM | POA: Diagnosis not present

## 2018-01-12 LAB — CYTOLOGY - PAP
Diagnosis: NEGATIVE
HPV (WINDOPATH): NOT DETECTED

## 2018-02-11 ENCOUNTER — Other Ambulatory Visit: Payer: Self-pay | Admitting: Family Medicine

## 2018-02-11 DIAGNOSIS — I10 Essential (primary) hypertension: Secondary | ICD-10-CM

## 2018-03-05 DIAGNOSIS — E890 Postprocedural hypothyroidism: Secondary | ICD-10-CM | POA: Diagnosis not present

## 2018-03-05 DIAGNOSIS — C73 Malignant neoplasm of thyroid gland: Secondary | ICD-10-CM | POA: Diagnosis not present

## 2018-03-08 DIAGNOSIS — J3081 Allergic rhinitis due to animal (cat) (dog) hair and dander: Secondary | ICD-10-CM | POA: Insufficient documentation

## 2018-03-08 DIAGNOSIS — J3089 Other allergic rhinitis: Secondary | ICD-10-CM | POA: Diagnosis not present

## 2018-03-08 DIAGNOSIS — J4520 Mild intermittent asthma, uncomplicated: Secondary | ICD-10-CM | POA: Diagnosis not present

## 2018-03-08 DIAGNOSIS — J301 Allergic rhinitis due to pollen: Secondary | ICD-10-CM | POA: Diagnosis not present

## 2018-03-08 DIAGNOSIS — K219 Gastro-esophageal reflux disease without esophagitis: Secondary | ICD-10-CM | POA: Insufficient documentation

## 2018-03-08 DIAGNOSIS — H1045 Other chronic allergic conjunctivitis: Secondary | ICD-10-CM | POA: Insufficient documentation

## 2018-03-08 DIAGNOSIS — R05 Cough: Secondary | ICD-10-CM | POA: Diagnosis not present

## 2018-03-11 NOTE — Progress Notes (Deleted)
   Barbara Cardenas is a 48 y.o. female here for an acute visit.  History of Present Illness:   {CMA SCRIBE ATTESTATION}  HPI:   PMHx, SurgHx, SocialHx, Medications, and Allergies were reviewed in the Visit Navigator and updated as appropriate.  Current Medications:   Current Outpatient Medications:  .  acetaminophen (TYLENOL) 160 mg/5 mL SOLN, Take by mouth., Disp: , Rfl:  .  albuterol (PROAIR HFA) 108 (90 Base) MCG/ACT inhaler, Reported on 06/29/2015-PRN, Disp: , Rfl:  .  ALPRAZolam (XANAX) 0.5 MG tablet, Take 1 tablet (0.5 mg total) by mouth as needed for anxiety. Reported on 04/02/2015, Disp: 30 tablet, Rfl: 0 .  Beclomethasone Dipropionate 80 MCG/ACT AERS, PLACE INTO THE NOSE, Disp: 8.7 g, Rfl: 11 .  clindamycin-benzoyl peroxide (BENZACLIN) gel, APPLY TO AFFECTED AREA ON FACE IN MORNING, Disp: , Rfl: 3 .  doxycycline (VIBRAMYCIN) 100 MG capsule, TAKE 1 CAPSULE BY MOUTH DAILY AS DIRECTED WITH FOOD, Disp: , Rfl: 3 .  hydrochlorothiazide (MICROZIDE) 12.5 MG capsule, TAKE 1 CAPSULE BY MOUTH EVERY DAY, Disp: 90 capsule, Rfl: 0 .  levocetirizine (XYZAL) 5 MG tablet, Take 5 mg by mouth every evening., Disp: , Rfl:  .  levothyroxine (SYNTHROID, LEVOTHROID) 75 MCG tablet, Take 75 mcg by mouth daily before breakfast., Disp: , Rfl:  .  Olopatadine HCl (PATADAY) 0.2 % SOLN, INSTILL 1-2 DROPS INTO BOTH EYES DAILY, Disp: , Rfl:    No Known Allergies Review of Systems:   Pertinent items are noted in the HPI. Otherwise, ROS is negative.  Vitals:  There were no vitals filed for this visit.   There is no height or weight on file to calculate BMI.  Physical Exam:   Physical Exam  Results for orders placed or performed in visit on 01/11/18  Cytology - PAP  Result Value Ref Range   Adequacy      Satisfactory for evaluation  endocervical/transformation zone component PRESENT.   Diagnosis      NEGATIVE FOR INTRAEPITHELIAL LESIONS OR MALIGNANCY.   Diagnosis      A LETTER WAS SENT TO THE  PATIENT INFORMING HER OF THE ABOVE RESULTS.   HPV NOT DETECTED    Material Submitted CervicoVaginal Pap [ThinPrep Imaged]     Assessment and Plan:   There are no diagnoses linked to this encounter.  . Reviewed expectations re: course of current medical issues. . Discussed self-management of symptoms. . Outlined signs and symptoms indicating need for more acute intervention. . Patient verbalized understanding and all questions were answered. Marland Kitchen Health Maintenance issues including appropriate healthy diet, exercise, and smoking avoidance were discussed with patient. . See orders for this visit as documented in the electronic medical record. . Patient received an After Visit Summary.  *** CMA served as Education administrator during this visit. History, Physical, and Plan performed by medical provider. The above documentation has been reviewed and is accurate and complete. Briscoe Deutscher, D.O.  Briscoe Deutscher, DO [place], [facility] 03/11/2018

## 2018-03-12 ENCOUNTER — Ambulatory Visit: Payer: BLUE CROSS/BLUE SHIELD | Admitting: Family Medicine

## 2018-03-15 ENCOUNTER — Other Ambulatory Visit: Payer: Self-pay

## 2018-03-15 DIAGNOSIS — H1045 Other chronic allergic conjunctivitis: Secondary | ICD-10-CM

## 2018-03-15 DIAGNOSIS — J4520 Mild intermittent asthma, uncomplicated: Secondary | ICD-10-CM

## 2018-03-15 DIAGNOSIS — K219 Gastro-esophageal reflux disease without esophagitis: Secondary | ICD-10-CM

## 2018-03-15 DIAGNOSIS — J3081 Allergic rhinitis due to animal (cat) (dog) hair and dander: Secondary | ICD-10-CM

## 2018-03-17 DIAGNOSIS — L700 Acne vulgaris: Secondary | ICD-10-CM | POA: Diagnosis not present

## 2018-03-17 DIAGNOSIS — L814 Other melanin hyperpigmentation: Secondary | ICD-10-CM | POA: Diagnosis not present

## 2018-03-26 DIAGNOSIS — C73 Malignant neoplasm of thyroid gland: Secondary | ICD-10-CM | POA: Diagnosis not present

## 2018-04-20 ENCOUNTER — Encounter: Payer: Self-pay | Admitting: Family Medicine

## 2018-04-20 ENCOUNTER — Ambulatory Visit (INDEPENDENT_AMBULATORY_CARE_PROVIDER_SITE_OTHER): Payer: BLUE CROSS/BLUE SHIELD

## 2018-04-20 ENCOUNTER — Ambulatory Visit: Payer: BLUE CROSS/BLUE SHIELD | Admitting: Family Medicine

## 2018-04-20 VITALS — BP 124/88 | HR 100 | Temp 98.0°F | Ht 68.0 in | Wt 171.2 lb

## 2018-04-20 DIAGNOSIS — J189 Pneumonia, unspecified organism: Secondary | ICD-10-CM

## 2018-04-20 DIAGNOSIS — R5383 Other fatigue: Secondary | ICD-10-CM | POA: Diagnosis not present

## 2018-04-20 DIAGNOSIS — R059 Cough, unspecified: Secondary | ICD-10-CM

## 2018-04-20 DIAGNOSIS — R918 Other nonspecific abnormal finding of lung field: Secondary | ICD-10-CM | POA: Diagnosis not present

## 2018-04-20 DIAGNOSIS — J101 Influenza due to other identified influenza virus with other respiratory manifestations: Secondary | ICD-10-CM | POA: Diagnosis not present

## 2018-04-20 DIAGNOSIS — R05 Cough: Secondary | ICD-10-CM

## 2018-04-20 LAB — CBC WITH DIFFERENTIAL/PLATELET
Basophils Absolute: 0 10*3/uL (ref 0.0–0.1)
Basophils Relative: 0.2 % (ref 0.0–3.0)
Eosinophils Absolute: 0.1 10*3/uL (ref 0.0–0.7)
Eosinophils Relative: 1.3 % (ref 0.0–5.0)
HCT: 40.3 % (ref 36.0–46.0)
Hemoglobin: 13.6 g/dL (ref 12.0–15.0)
Lymphocytes Relative: 6.3 % — ABNORMAL LOW (ref 12.0–46.0)
Lymphs Abs: 0.6 10*3/uL — ABNORMAL LOW (ref 0.7–4.0)
MCHC: 33.6 g/dL (ref 30.0–36.0)
MCV: 86.7 fl (ref 78.0–100.0)
Monocytes Absolute: 0.7 10*3/uL (ref 0.1–1.0)
Monocytes Relative: 7.4 % (ref 3.0–12.0)
Neutro Abs: 8.5 10*3/uL — ABNORMAL HIGH (ref 1.4–7.7)
Neutrophils Relative %: 84.8 % — ABNORMAL HIGH (ref 43.0–77.0)
Platelets: 241 10*3/uL (ref 150.0–400.0)
RBC: 4.65 Mil/uL (ref 3.87–5.11)
RDW: 13.9 % (ref 11.5–15.5)
WBC: 10 10*3/uL (ref 4.0–10.5)

## 2018-04-20 LAB — IBC + FERRITIN
Ferritin: 173.1 ng/mL (ref 10.0–291.0)
Iron: 29 ug/dL — ABNORMAL LOW (ref 42–145)
Saturation Ratios: 7.5 % — ABNORMAL LOW (ref 20.0–50.0)
Transferrin: 278 mg/dL (ref 212.0–360.0)

## 2018-04-20 MED ORDER — PREDNISONE 5 MG PO TABS
5.0000 mg | ORAL_TABLET | Freq: Every day | ORAL | 0 refills | Status: DC
Start: 2018-04-20 — End: 2018-06-15

## 2018-04-20 MED ORDER — OSELTAMIVIR PHOSPHATE 75 MG PO CAPS: 75.0000 mg | ORAL_CAPSULE | Freq: Two times a day (BID) | ORAL | 0 refills | Status: DC

## 2018-04-20 MED ORDER — HYDROCOD POLST-CPM POLST ER 10-8 MG/5ML PO SUER: 5.0000 mL | Freq: Every evening | ORAL | 0 refills | Status: DC | PRN

## 2018-04-21 ENCOUNTER — Encounter: Payer: Self-pay | Admitting: Family Medicine

## 2018-04-22 ENCOUNTER — Ambulatory Visit (INDEPENDENT_AMBULATORY_CARE_PROVIDER_SITE_OTHER): Payer: BLUE CROSS/BLUE SHIELD

## 2018-04-22 ENCOUNTER — Ambulatory Visit: Payer: BLUE CROSS/BLUE SHIELD | Admitting: Family Medicine

## 2018-04-22 ENCOUNTER — Encounter: Payer: Self-pay | Admitting: Family Medicine

## 2018-04-22 ENCOUNTER — Ambulatory Visit: Payer: Self-pay

## 2018-04-22 VITALS — BP 112/88 | HR 80 | Temp 97.9°F | Ht 68.0 in | Wt 174.0 lb

## 2018-04-22 DIAGNOSIS — J189 Pneumonia, unspecified organism: Secondary | ICD-10-CM | POA: Diagnosis not present

## 2018-04-22 DIAGNOSIS — R918 Other nonspecific abnormal finding of lung field: Secondary | ICD-10-CM | POA: Diagnosis not present

## 2018-04-22 DIAGNOSIS — J181 Lobar pneumonia, unspecified organism: Secondary | ICD-10-CM | POA: Diagnosis not present

## 2018-04-22 LAB — POCT INFLUENZA A/B
Influenza A, POC: POSITIVE — AB
Influenza B, POC: NEGATIVE

## 2018-04-22 MED ORDER — AZITHROMYCIN 250 MG PO TABS: ORAL_TABLET | ORAL | 0 refills | Status: DC

## 2018-04-22 MED ORDER — ACETAMINOPHEN-CODEINE #3 300-30 MG PO TABS: 1.0000 | ORAL_TABLET | Freq: Four times a day (QID) | ORAL | 0 refills | Status: DC | PRN

## 2018-04-22 MED ORDER — AMOXICILLIN 500 MG PO CAPS: 1000.0000 mg | ORAL_CAPSULE | Freq: Three times a day (TID) | ORAL | 0 refills | Status: DC

## 2018-04-22 NOTE — Telephone Encounter (Signed)
Pt was seen this morning by Dr. Rogers Blocker.

## 2018-04-22 NOTE — Patient Instructions (Signed)
Ibuprofen 800mg  up to three times/day for pain/inflammation/fever. Usually take with food.   Tylenol you can have 3000mg /day. The codeine pill has 300mg  of tylenol in it, so just watch the dosage  You will be on 2 antibiotics to cover for pneumonia 1) zpack: 2 pills on day one then one pill day 2-5.  2) amoxicillin: 1000mg  three times a day for 10 days.   I like robitussin DM during the day for cough Honey daily Cool mist humidifier.    Community-Acquired Pneumonia, Adult Pneumonia is an infection of the lungs. It causes swelling in the airways of the lungs. Mucus and fluid may also build up inside the airways. One type of pneumonia can happen while a person is in a hospital. A different type can happen when a person is not in a hospital (community-acquired pneumonia).  What are the causes?  This condition is caused by germs (viruses, bacteria, or fungi). Some types of germs can be passed from one person to another. This can happen when you breathe in droplets from the cough or sneeze of an infected person. What increases the risk? You are more likely to develop this condition if you:  Have a long-term (chronic) disease, such as: ? Chronic obstructive pulmonary disease (COPD). ? Asthma. ? Cystic fibrosis. ? Congestive heart failure. ? Diabetes. ? Kidney disease.  Have HIV.  Have sickle cell disease.  Have had your spleen removed.  Do not take good care of your teeth and mouth (poor dental hygiene).  Have a medical condition that increases the risk of breathing in droplets from your own mouth and nose.  Have a weakened body defense system (immune system).  Are a smoker.  Travel to areas where the germs that cause this illness are common.  Are around certain animals or the places they live. What are the signs or symptoms?  A dry cough.  A wet (productive) cough.  Fever.  Sweating.  Chest pain. This often happens when breathing deeply or coughing.  Fast  breathing or trouble breathing.  Shortness of breath.  Shaking chills.  Feeling tired (fatigue).  Muscle aches. How is this treated? Treatment for this condition depends on many things. Most adults can be treated at home. In some cases, treatment must happen in a hospital. Treatment may include:  Medicines given by mouth or through an IV tube.  Being given extra oxygen.  Respiratory therapy. In rare cases, treatment for very bad pneumonia may include:  Using a machine to help you breathe.  Having a procedure to remove fluid from around your lungs. Follow these instructions at home: Medicines  Take over-the-counter and prescription medicines only as told by your doctor. ? Only take cough medicine if you are losing sleep.  If you were prescribed an antibiotic medicine, take it as told by your doctor. Do not stop taking the antibiotic even if you start to feel better. General instructions   Sleep with your head and neck raised (elevated). You can do this by sleeping in a recliner or by putting a few pillows under your head.  Rest as needed. Get at least 8 hours of sleep each night.  Drink enough water to keep your pee (urine) pale yellow.  Eat a healthy diet that includes plenty of vegetables, fruits, whole grains, low-fat dairy products, and lean protein.  Do not use any products that contain nicotine or tobacco. These include cigarettes, e-cigarettes, and chewing tobacco. If you need help quitting, ask your doctor.  Keep all  follow-up visits as told by your doctor. This is important. How is this prevented? A shot (vaccine) can help prevent pneumonia. Shots are often suggested for:  People older than 47 years of age.  People older than 47 years of age who: ? Are having cancer treatment. ? Have long-term (chronic) lung disease. ? Have problems with their body's defense system. You may also prevent pneumonia if you take these actions:  Get the flu (influenza) shot  every year.  Go to the dentist as often as told.  Wash your hands often. If you cannot use soap and water, use hand sanitizer. Contact a doctor if:  You have a fever.  You lose sleep because your cough medicine does not help. Get help right away if:  You are short of breath and it gets worse.  You have more chest pain.  Your sickness gets worse. This is very serious if: ? You are an older adult. ? Your body's defense system is weak.  You cough up blood. Summary  Pneumonia is an infection of the lungs.  Most adults can be treated at home. Some will need treatment in a hospital.  Drink enough water to keep your pee pale yellow.  Get at least 8 hours of sleep each night. This information is not intended to replace advice given to you by your health care provider. Make sure you discuss any questions you have with your health care provider. Document Released: 07/30/2007 Document Revised: 10/08/2017 Document Reviewed: 10/08/2017 Elsevier Interactive Patient Education  2019 [employer].

## 2018-04-22 NOTE — Telephone Encounter (Signed)
Pt. Reports she was diagnosed with flu and pneumonia this week.Yesterday she felt "a little better, but at 0400 this morning a pain in my left back at the ribs - woke me up." Pain with breathing that was 8/10. Took Motrin "which helped a  Little." Pain is now with taking a deep breath only. Feels like she is getting enough oxygen, denies chest pain. Pain does not radiate, stays in one area.Appointment made for this morning. Reports she had a chest x-ray this week.  Reason for Disposition . [1] Fever AND [2] no symptoms of UTI  (Exception: has generalized muscle pains, not localized back pain)  Answer Assessment - Initial Assessment Questions 1. ONSET: "When did the pain begin?"      Last night at 4 am and became more intense 2. LOCATION: "Where does it hurt?" (upper, mid or lower back)     Left side of back at the ribs 3. SEVERITY: "How bad is the pain?"  (e.g., Scale 1-10; mild, moderate, or severe)   - MILD (1-3): doesn't interfere with normal activities    - MODERATE (4-7): interferes with normal activities or awakens from sleep    - SEVERE (8-10): excruciating pain, unable to do any normal activities      At it's worse the pain a 8 4. PATTERN: "Is the pain constant?" (e.g., yes, no; constant, intermittent)      Constant 5. RADIATION: "Does the pain shoot into your legs or elsewhere?"     No 6. CAUSE:  "What do you think is causing the back pain?"      Unsure 7. BACK OVERUSE:  "Any recent lifting of heavy objects, strenuous work or exercise?"     No 8. MEDICATIONS: "What have you taken so far for the pain?" (e.g., nothing, acetaminophen, NSAIDS)     Motrin 9. NEUROLOGIC SYMPTOMS: "Do you have any weakness, numbness, or problems with bowel/bladder control?"     No 10. OTHER SYMPTOMS: "Do you have any other symptoms?" (e.g., fever, abdominal pain, burning with urination, blood in urine)       Pain with a deep breath 11. PREGNANCY: "Is there any chance you are pregnant?" (e.g., yes, no;  LMP)       No  Protocols used: BACK PAIN-A-AH

## 2018-04-22 NOTE — Progress Notes (Signed)
Patient: Barbara Cardenas MRN: [id number] DOB: 1970-10-25 PCP: Briscoe Deutscher, DO     Subjective:  Chief Complaint  Patient presents with  . L sided rib pain    dx on 2/25 w/Influenza A and pneumonia    HPI: The patient is a 48 y.o. female who presents today for worsening shortness of breath. She was diagnosed on 2/25 with the flu and was started on tamiflu, steroids, inhaler, codeine cough syrup. Last night she woke up around 4Am and felt mildly uncomfortable and having intense pain 8/10 on the left posterior back/CVA with breathing in. She took ibuprofen which helped a lot. She is able to breath, but it's very shallow. Last night every breath felt like someone was punching her and deep breaths felt like a stab. No fevers in the last few days, but has been taking both tylenol and ibuprofen. She did not start the tamiflu as she has side effects from this.   Review of Systems  Constitutional: Positive for fatigue. Negative for chills and fever.  Respiratory: Positive for cough.   Cardiovascular: Positive for chest pain.       C/o left sided rib pain.  Particularly painful when taking dee breath  Gastrointestinal: Negative for abdominal pain, nausea and vomiting.  Musculoskeletal: Positive for back pain. Negative for neck pain.       Left sided rib pain that radiates to mid-lower left back  Neurological: Positive for light-headedness. Negative for dizziness and headaches.    Allergies Patient has No Known Allergies.  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  Surgical History Patient  has a past surgical history that includes Thyroidectomy, partial.  Family History Pateint's family history includes Allergies in her father; CAD in her  father; Healthy in her mother; Tremor in her father; Ulcerative colitis in her father.  Social History Patient  reports that she has never smoked. She has never used smokeless tobacco. She reports that she does not drink alcohol or use drugs.    Objective: Vitals:   04/22/18 0927  BP: 112/88  Pulse: 80  Temp: 97.9 F (36.6 C)  TempSrc: Oral  SpO2: 99%  Weight: 174 lb (78.9 kg)  Height: 5\' 8"  (1.727 m)    Body mass index is 26.46 kg/m.  Physical Exam Vitals signs reviewed.  Constitutional:      Appearance: Normal appearance.  HENT:     Right Ear: Tympanic membrane, ear canal and external ear normal.     Left Ear: Tympanic membrane, ear canal and external ear normal.     Nose: Nose normal. No congestion.     Mouth/Throat:     Mouth: Mucous membranes are moist.     Pharynx: No oropharyngeal exudate.  Neck:     Musculoskeletal: Normal range of motion and neck supple. No neck rigidity.  Cardiovascular:     Rate and Rhythm: Normal rate and regular rhythm.     Heart sounds: Normal heart sounds.  Pulmonary:     Effort: Pulmonary effort is normal. No respiratory distress.     Breath sounds: Rales (faint in LLL) present. No wheezing.  Abdominal:     General: Abdomen is flat. Bowel sounds are normal.     Palpations: Abdomen is soft.  Skin:    General: Skin is warm and dry.     Capillary Refill: Capillary  refill takes less than 2 seconds.  Neurological:     General: No focal deficit present.     Mental Status: She is alert and oriented to person, place, and time.  Psychiatric:        Mood and Affect: Mood normal.        Behavior: Behavior normal.    CXR: not much change from CXR on 2/25 from opacity in lll. If anything, slight improvement. Official read pending.      Assessment/plan: 1. Pneumonia of left lower lobe due to infectious organism ([place]) Vs. Flu.  - DG Chest 2 View; Future  Covering her for CAP with zpack and amoxicillin. Pain could be from pneumonia/muscle  strain form coughing/pleuritis. Continue deep breathing/heating pad/ibuprofen and will do tylenol 3 for severe pain as she is very concerned about this. Also recommended cool mist humidifier, honey and robitussin DM if needed for cough. This is improving though. Precautions given for worsening symptoms.  - DG Chest 2 View; Future   Return if symptoms worsen or fail to improve.   Orma Flaming, MD [place]   04/22/2018

## 2018-04-22 NOTE — Progress Notes (Signed)
Barbara Cardenas is a 48 y.o. female here for an acute visit.  History of Present Illness:   HPI: Cough, malaise, left chest congestion and pain with inspiration, + fevers. Had flu shot. Nonsmoker. Tried Tylenol without relief. Daughter was sick last week.   PMHx, SurgHx, SocialHx, Medications, and Allergies were reviewed in the Visit Navigator and updated as appropriate.  Current Medications   Current Outpatient Medications:  .  albuterol (PROAIR HFA) 108 (90 Base) MCG/ACT inhaler, Reported on 06/29/2015-PRN, Disp: , Rfl:  .  ALPRAZolam (XANAX) 0.5 MG tablet, Take 1 tablet (0.5 mg total) by mouth as needed for anxiety. Reported on 04/02/2015, Disp: 30 tablet, Rfl: 0 .  fexofenadine (ALLEGRA) 180 MG tablet, Take 180 mg by mouth daily., Disp: , Rfl:  .  levothyroxine (SYNTHROID, LEVOTHROID) 100 MCG tablet, Take on an empty stomach with a glass of water. One tab daily except Saturday, one and half tab on Saturday, Disp: , Rfl:  .  montelukast (SINGULAIR) 10 MG tablet, , Disp: , Rfl:  .  Olopatadine HCl (PATADAY) 0.2 % SOLN, INSTILL 1-2 DROPS INTO BOTH EYES DAILY, Disp: , Rfl:  .  acetaminophen-codeine (TYLENOL #3) 300-30 MG tablet, Take 1 tablet by mouth every 6 (six) hours as needed for moderate pain., Disp: 20 tablet, Rfl: 0 .  amoxicillin (AMOXIL) 500 MG capsule, Take 2 capsules (1,000 mg total) by mouth 3 (three) times daily., Disp: 60 capsule, Rfl: 0  No Known Allergies   Review of Systems   Pertinent items are noted in the HPI. Otherwise, ROS is negative.  Vitals   Vitals:   04/20/18 1350  BP: 124/88  Pulse: 100  Temp: 98 F (36.7 C)  TempSrc: Oral  SpO2: 98%  Weight: 171 lb 3.2 oz (77.7 kg)  Height: 5\' 8"  (1.727 m)     Body mass index is 26.03 kg/m.  Physical Exam   Physical Exam Vitals signs and nursing note reviewed.  Constitutional:      Appearance: She is normal weight. She is ill-appearing.  HENT:     Head: Normocephalic and atraumatic.     Right Ear:  Tympanic membrane normal.     Left Ear: Tympanic membrane normal.     Nose: Mucosal edema and rhinorrhea present.     Mouth/Throat:     Mouth: Mucous membranes are moist.  Eyes:     Pupils: Pupils are equal, round, and reactive to light.  Neck:     Musculoskeletal: Normal range of motion and neck supple.  Cardiovascular:     Rate and Rhythm: Normal rate and regular rhythm.     Heart sounds: Normal heart sounds.  Pulmonary:     Effort: Pulmonary effort is normal.     Breath sounds: Wheezing and rhonchi present.  Abdominal:     Palpations: Abdomen is soft.  Skin:    General: Skin is warm.  Neurological:     Mental Status: She is alert.  Psychiatric:        Behavior: Behavior normal.     Assessment and Plan   Barbara Cardenas was seen today for left sided neck pain, cough and fatigue.  Diagnoses and all orders for this visit:  Cough -     DG Chest 2 View -     predniSONE (DELTASONE) 5 MG tablet; Take 1 tablet (5 mg total) by mouth daily with breakfast. 6-5-4-3-2-1 -     chlorpheniramine-HYDROcodone (TUSSIONEX PENNKINETIC ER) 10-8 MG/5ML SUER; Take 5 mLs by mouth at bedtime as  needed for cough.  Influenza A -     POCT Influenza A/B -     oseltamivir (TAMIFLU) 75 MG capsule; Take 1 capsule (75 mg total) by mouth 2 (two) times daily. (Patient not taking: Reported on 04/22/2018)  Fatigue, unspecified type -     CBC with Differential/Platelet -     Cancel: Iron, TIBC and Ferritin Panel -     IBC + Ferritin    . Reviewed expectations re: course of current medical issues. . Discussed self-management of symptoms. . Outlined signs and symptoms indicating need for more acute intervention. . Patient verbalized understanding and all questions were answered. Marland Kitchen Health Maintenance issues including appropriate healthy diet, exercise, and smoking avoidance were discussed with patient. . See orders for this visit as documented in the electronic medical record. . Patient received an After Visit  Summary.  Briscoe Deutscher, DO [place], [facility] 04/22/2018

## 2018-04-22 NOTE — Telephone Encounter (Signed)
See note

## 2018-05-04 ENCOUNTER — Ambulatory Visit: Payer: Self-pay

## 2018-05-04 NOTE — Telephone Encounter (Signed)
Please advise 

## 2018-05-04 NOTE — Telephone Encounter (Signed)
Patient called and says she's been still running a low grade temperature of 99.6 this morning, still feeling tired and still have a cough. She says she wants to know if she should give the symptoms time to resolve on their own or should she get another round of antibiotics for pneumonia. I advised I will send over to Dr. Juleen China for review and someone will call with her recommendation.    Reason for Disposition . [1] Caller requesting NON-URGENT health information AND [2] PCP's office is the best resource  Protocols used: INFORMATION ONLY CALL-A-AH

## 2018-05-05 NOTE — Telephone Encounter (Signed)
Call patient and ask if she can come in at noon. Come through side door. Let me evaluate. I will want a CT chest.

## 2018-05-05 NOTE — Telephone Encounter (Signed)
Spoke to pt, told her Dr. Juleen China would like her to be seen today but she has no openings so she would like you to see Barbara Cardenas. Pt said she can not come in today she has too much going on but can come in tomorrow. Pt said she is feeling a little better, but tired, temp was 99.4 this am, cough is still present but less. Told pt to come in tomorrow at 11:20 to see Aldona Bar, come to the side door on the left side of the building and ring the bell. Pt verbalized understanding.

## 2018-05-06 ENCOUNTER — Encounter: Payer: Self-pay | Admitting: Physician Assistant

## 2018-05-06 ENCOUNTER — Ambulatory Visit (INDEPENDENT_AMBULATORY_CARE_PROVIDER_SITE_OTHER): Payer: BLUE CROSS/BLUE SHIELD | Admitting: Physician Assistant

## 2018-05-06 VITALS — BP 120/88 | HR 86 | Temp 98.3°F

## 2018-05-06 DIAGNOSIS — F40243 Fear of flying: Secondary | ICD-10-CM

## 2018-05-06 DIAGNOSIS — J181 Lobar pneumonia, unspecified organism: Secondary | ICD-10-CM | POA: Diagnosis not present

## 2018-05-06 DIAGNOSIS — J189 Pneumonia, unspecified organism: Secondary | ICD-10-CM

## 2018-05-06 LAB — CBC WITH DIFFERENTIAL/PLATELET
Basophils Absolute: 0 10*3/uL (ref 0.0–0.1)
Basophils Relative: 0.3 % (ref 0.0–3.0)
Eosinophils Absolute: 0.1 10*3/uL (ref 0.0–0.7)
Eosinophils Relative: 1.8 % (ref 0.0–5.0)
HCT: 35.2 % — ABNORMAL LOW (ref 36.0–46.0)
HEMOGLOBIN: 11.8 g/dL — AB (ref 12.0–15.0)
Lymphocytes Relative: 13.9 % (ref 12.0–46.0)
Lymphs Abs: 0.9 10*3/uL (ref 0.7–4.0)
MCHC: 33.4 g/dL (ref 30.0–36.0)
MCV: 85.8 fl (ref 78.0–100.0)
Monocytes Absolute: 0.5 10*3/uL (ref 0.1–1.0)
Monocytes Relative: 8.4 % (ref 3.0–12.0)
Neutro Abs: 4.7 10*3/uL (ref 1.4–7.7)
Neutrophils Relative %: 75.6 % (ref 43.0–77.0)
Platelets: 274 10*3/uL (ref 150.0–400.0)
RBC: 4.1 Mil/uL (ref 3.87–5.11)
RDW: 14 % (ref 11.5–15.5)
WBC: 6.3 10*3/uL (ref 4.0–10.5)

## 2018-05-06 LAB — COMPREHENSIVE METABOLIC PANEL
ALT: 13 U/L (ref 0–35)
AST: 13 U/L (ref 0–37)
Albumin: 4.3 g/dL (ref 3.5–5.2)
Alkaline Phosphatase: 69 U/L (ref 39–117)
BUN: 12 mg/dL (ref 6–23)
CO2: 27 mEq/L (ref 19–32)
Calcium: 9.4 mg/dL (ref 8.4–10.5)
Chloride: 104 mEq/L (ref 96–112)
Creatinine, Ser: 0.71 mg/dL (ref 0.40–1.20)
GFR: 88.43 mL/min (ref >60.00)
Glucose, Bld: 92 mg/dL (ref 70–99)
Potassium: 3.8 mEq/L (ref 3.5–5.1)
Sodium: 139 mEq/L (ref 135–145)
Total Bilirubin: 0.4 mg/dL (ref 0.2–1.2)
Total Protein: 7.2 g/dL (ref 6.0–8.3)

## 2018-05-06 MED ORDER — ALPRAZOLAM 0.5 MG PO TABS: 0.5000 mg | ORAL_TABLET | ORAL | 0 refills | Status: DC | PRN

## 2018-05-06 NOTE — Patient Instructions (Signed)
It was great to see you!  We will be in touch with your lab results.  Please contact us about the plan for your CT scan.  Take care,  [name] PA-C

## 2018-05-07 ENCOUNTER — Encounter: Payer: Self-pay | Admitting: Physician Assistant

## 2018-05-07 ENCOUNTER — Telehealth: Payer: Self-pay | Admitting: Family Medicine

## 2018-05-07 NOTE — Telephone Encounter (Signed)
Forwarding to [facility]

## 2018-05-07 NOTE — Progress Notes (Signed)
Barbara Cardenas is a 48 y.o. female here for a new problem.  History of Present Illness:   Chief Complaint  Patient presents with  . Cough    HPI  Patient was seen by PCP on 04/20/2018 -- diagnosed with flu A and L lateral lung base PNA. She was started on prednisone, z-pack, tamiflu, and hycodan.  She returned on 04/22/2018 -- for worsening cough, repeat chest xray which showed persistent infiltrate and also a focal opacity that may represent mucous plugging. She was started on amoxicillin.  She is here for ongoing cough. Denies fevers, chills in the past 24 hours. She is eating and drinking well. She is very limited with what she is able to take OTC. Denies chest pain or SOB.   Past Medical History:  Diagnosis Date  . Cancer of thyroid ([place]) 09/07/2015  . 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 Hypercholesteremia 12/21/2013  . Neuropathy   . Postoperative hypothyroidism 09/07/2015  . Seasonal allergies 07/20/2016  . Thyroid cancer ([place])   . Vision abnormalities      Social History   Socioeconomic History  . Marital status: Married    Spouse name: Not on file  . Number of children: 3  . Years of education: Not on file  . Highest education level: Not on file  Occupational History  . 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 Financial resource strain: Not on file  . Food insecurity:    Worry: Not on file    Inability: Not on file  . Transportation needs:    Medical: Not on file    Non-medical: Not on file  Tobacco Use  . Smoking status: Never Smoker  . Smokeless tobacco: Never Used  Substance and Sexual Activity  . Alcohol use: No  . Drug use: No  . Sexual activity: Not on file  Lifestyle  . Physical activity:    Days per week: Not on file    Minutes per session: Not on file  . Stress: Not on file  Relationships  . Social connections:    Talks on phone: Not on file    Gets together: Not on file    Attends religious service: Not on file    Active member of club or  organization: Not on file    Attends meetings of clubs or organizations: Not on file    Relationship status: Not on file  . Intimate partner violence:    Fear of current or ex partner: Not on file    Emotionally abused: Not on file    Physically abused: Not on file    Forced sexual activity: Not on file  Other Topics Concern  . Not on file  Social History Narrative   Mother of three. Husband works out of town often. She eats well and exercises regularly.     Past Surgical History:  Procedure Laterality Date  . THYROIDECTOMY, PARTIAL      Family History  Problem Relation Age of Onset  . Healthy Mother   . CAD Father   . Allergies Father   . Ulcerative colitis Father   . Tremor Father   . Neuropathy Neg Hx     No Known Allergies  Current Medications:   Current Outpatient Medications:  .  acetaminophen-codeine (TYLENOL #3) 300-30 MG tablet, Take 1 tablet by mouth every 6 (six) hours as needed for moderate pain., Disp: 20 tablet, Rfl: 0 .  albuterol (PROAIR HFA) 108 (90 Base) MCG/ACT inhaler, Reported on 06/29/2015-PRN, Disp: ,  Rfl:  .  ALPRAZolam (XANAX) 0.5 MG tablet, Take 1 tablet (0.5 mg total) by mouth as needed for anxiety. Reported on 04/02/2015, Disp: 30 tablet, Rfl: 0 .  amoxicillin (AMOXIL) 500 MG capsule, Take 2 capsules (1,000 mg total) by mouth 3 (three) times daily., Disp: 60 capsule, Rfl: 0 .  azithromycin (ZITHROMAX) 250 MG tablet, 2 pills today and then 1 pill days 2-5., Disp: 6 tablet, Rfl: 0 .  chlorpheniramine-HYDROcodone (TUSSIONEX PENNKINETIC ER) 10-8 MG/5ML SUER, Take 5 mLs by mouth at bedtime as needed for cough., Disp: 60 mL, Rfl: 0 .  fexofenadine (ALLEGRA) 180 MG tablet, Take 180 mg by mouth daily., Disp: , Rfl:  .  levothyroxine (SYNTHROID, LEVOTHROID) 100 MCG tablet, Take on an empty stomach with a glass of water. One tab daily except Saturday, one and half tab on Saturday, Disp: , Rfl:  .  montelukast (SINGULAIR) 10 MG tablet, , Disp: , Rfl:  .   Olopatadine HCl (PATADAY) 0.2 % SOLN, INSTILL 1-2 DROPS INTO BOTH EYES DAILY, Disp: , Rfl:  .  oseltamivir (TAMIFLU) 75 MG capsule, Take 1 capsule (75 mg total) by mouth 2 (two) times daily., Disp: 10 capsule, Rfl: 0 .  predniSONE (DELTASONE) 5 MG tablet, Take 1 tablet (5 mg total) by mouth daily with breakfast. 6-5-4-3-2-1, Disp: 21 tablet, Rfl: 0   Review of Systems:   Review of Systems  Constitutional: Negative for chills, fever, malaise/fatigue and weight loss.  Respiratory: Positive for cough. Negative for shortness of breath.   Cardiovascular: Negative for chest pain, orthopnea, claudication and leg swelling.  Gastrointestinal: Negative for heartburn, nausea and vomiting.  Neurological: Negative for dizziness, tingling and headaches.    Vitals:   Vitals:   05/06/18 1130  BP: 120/88  Pulse: 86  Temp: 98.3 F (36.8 C)  TempSrc: Oral  SpO2: 97%     There is no height or weight on file to calculate BMI.  Physical Exam:   Physical Exam Vitals signs and nursing note reviewed.  Constitutional:      General: She is not in acute distress.    Appearance: She is well-developed. She is not ill-appearing or toxic-appearing.  HENT:     Head: Normocephalic and atraumatic.     Right Ear: Tympanic membrane, ear canal and external ear normal. Tympanic membrane is not erythematous, retracted or bulging.     Left Ear: Tympanic membrane, ear canal and external ear normal. Tympanic membrane is not erythematous, retracted or bulging.     Nose: Mucosal edema, congestion and rhinorrhea present.     Right Sinus: No maxillary sinus tenderness or frontal sinus tenderness.     Left Sinus: No maxillary sinus tenderness or frontal sinus tenderness.     Mouth/Throat:     Lips: Pink.     Mouth: Mucous membranes are moist.     Pharynx: Uvula midline. Posterior oropharyngeal erythema present.  Eyes:     General: Lids are normal.     Conjunctiva/sclera: Conjunctivae normal.  Neck:     Trachea:  Trachea normal.  Cardiovascular:     Rate and Rhythm: Normal rate and regular rhythm.     Heart sounds: Normal heart sounds, S1 normal and S2 normal.  Pulmonary:     Effort: Pulmonary effort is normal.     Breath sounds: Normal breath sounds. No decreased breath sounds, wheezing, rhonchi or rales.  Lymphadenopathy:     Cervical: No cervical adenopathy.  Skin:    General: Skin is warm and dry.  Neurological:     Mental Status: She is alert.  Psychiatric:        Speech: Speech normal.        Behavior: Behavior normal. Behavior is cooperative.    Results for orders placed or performed in visit on 05/06/18  CBC with Differential/Platelet  Result Value Ref Range   WBC 6.3 4.0 - 10.5 K/uL   RBC 4.10 3.87 - 5.11 Mil/uL   Hemoglobin 11.8 (L) 12.0 - 15.0 g/dL   HCT 35.2 (L) 36.0 - 46.0 %   MCV 85.8 78.0 - 100.0 fl   MCHC 33.4 30.0 - 36.0 g/dL   RDW 14.0 11.5 - 15.5 %   Platelets 274.0 150.0 - 400.0 K/uL   Neutrophils Relative % 75.6 43.0 - 77.0 %   Lymphocytes Relative 13.9 12.0 - 46.0 %   Monocytes Relative 8.4 3.0 - 12.0 %   Eosinophils Relative 1.8 0.0 - 5.0 %   Basophils Relative 0.3 0.0 - 3.0 %   Neutro Abs 4.7 1.4 - 7.7 K/uL   Lymphs Abs 0.9 0.7 - 4.0 K/uL   Monocytes Absolute 0.5 0.1 - 1.0 K/uL   Eosinophils Absolute 0.1 0.0 - 0.7 K/uL   Basophils Absolute 0.0 0.0 - 0.1 K/uL  Comprehensive metabolic panel  Result Value Ref Range   Sodium 139 135 - 145 mEq/L   Potassium 3.8 3.5 - 5.1 mEq/L   Chloride 104 96 - 112 mEq/L   CO2 27 19 - 32 mEq/L   Glucose, Bld 92 70 - 99 mg/dL   BUN 12 6 - 23 mg/dL   Creatinine, Ser 0.71 0.40 - 1.20 mg/dL   Total Bilirubin 0.4 0.2 - 1.2 mg/dL   Alkaline Phosphatase 69 39 - 117 U/L   AST 13 0 - 37 U/L   ALT 13 0 - 35 U/L   Total Protein 7.2 6.0 - 8.3 g/dL   Albumin 4.3 3.5 - 5.2 g/dL   Calcium 9.4 8.4 - 10.5 mg/dL   GFR 88.43 >60.00 mL/min     Assessment and Plan:   Shilee was seen today for cough.  Diagnoses and all orders for  this visit:  Pneumonia of left lower lobe due to infectious organism [facility]) Suspect post-viral cough, however due to abnormal chest xrays will obtain CT scan. Labs performed as well today. Further work-up based on CT scan results. -     CBC with Differential/Platelet -     Comprehensive metabolic panel -     CT Chest Wo Contrast; Future  Fear of flying -     ALPRAZolam (XANAX) 0.5 MG tablet; Take 1 tablet (0.5 mg total) by mouth as needed for anxiety. Reported on 04/02/2015  . Reviewed expectations re: course of current medical issues. . Discussed self-management of symptoms. . Outlined signs and symptoms indicating need for more acute intervention. . Patient verbalized understanding and all questions were answered. . See orders for this visit as documented in the electronic medical record. . Patient received an After-Visit Summary.    [name], PA-C

## 2018-05-07 NOTE — Telephone Encounter (Signed)
See note  Copied from [phone]. Topic: General - Other >> May 07, 2018  9:04 AM Alanda Slim E wrote: Reason for CRM: Pt wanted Aldona Bar to know that she is no longer running a fever and just has a cough so she wants to put the CT scan on hold for a couple of weeks to see if the cough would go away first

## 2018-05-07 NOTE — Telephone Encounter (Signed)
See message.

## 2018-05-07 NOTE — Telephone Encounter (Signed)
Noted  

## 2018-05-24 ENCOUNTER — Ambulatory Visit: Payer: BLUE CROSS/BLUE SHIELD | Admitting: Family Medicine

## 2018-06-15 ENCOUNTER — Ambulatory Visit (INDEPENDENT_AMBULATORY_CARE_PROVIDER_SITE_OTHER): Payer: BLUE CROSS/BLUE SHIELD | Admitting: Physician Assistant

## 2018-06-15 ENCOUNTER — Encounter: Payer: Self-pay | Admitting: Physician Assistant

## 2018-06-15 DIAGNOSIS — H9201 Otalgia, right ear: Secondary | ICD-10-CM | POA: Diagnosis not present

## 2018-06-15 MED ORDER — AMOXICILLIN 875 MG PO TABS: 875.0000 mg | ORAL_TABLET | Freq: Two times a day (BID) | ORAL | 0 refills | Status: DC

## 2018-06-15 MED ORDER — PREDNISONE 20 MG PO TABS: ORAL_TABLET | ORAL | 0 refills | Status: DC

## 2018-06-15 NOTE — Progress Notes (Signed)
Virtual Visit via Video   I connected with Barbara Cardenas on 06/15/18 at 10:40 AM EDT by a video enabled telemedicine application and verified that I am speaking with the correct person using two identifiers. Location patient: Home Location provider: [facility], Office Persons participating in the virtual visit: Barbara Cardenas, Barbara Coke, PA-C   I discussed the limitations of evaluation and management by telemedicine and the availability of in person appointments. The patient expressed understanding and agreed to proceed.  Subjective:   HPI:  Otalgia Pt c/o right ear pain, started on 3-4 days. Pressure in R eye. Severe seasonal allergies that she takes "loads of medications for". Wears a mask to filter out pollen when she walks. No fever, cough, SOB, chest pain. Hx of sinus infections and ETD. Has only tried tylenol for her symptoms (other than her usual allergy/asthma meds she is currently on.)  ROS: See pertinent positives and negatives per HPI.  Patient Active Problem List   Diagnosis Date Noted  . Allergic rhinitis due to animal (cat) (dog) hair and dander 03/08/2018  . GERD (gastroesophageal reflux disease) 03/08/2018  . Mild intermittent asthma 03/08/2018  . Other chronic allergic conjunctivitis 03/08/2018  . Connective tissue disease, undifferentiated ([place]) 08/16/2017  . Dysfunction of right eustachian tube 12/07/2016  . History of thyroid cancer 07/20/2016  . Seasonal allergies 07/20/2016  . 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 Postoperative hypothyroidism 09/07/2015  . Hypercholesteremia 12/21/2013    Social History   Tobacco Use  . Smoking status: Never Smoker  . Smokeless tobacco: Never Used  Substance Use Topics  . Alcohol use: No    Current Outpatient Medications:  .  acetaminophen-codeine (TYLENOL #3) 300-30 MG tablet, Take 1 tablet by mouth every 6 (six) hours as needed for moderate pain., Disp: 20 tablet, Rfl: 0 .  albuterol (PROAIR HFA) 108 (90  Base) MCG/ACT inhaler, Reported on 06/29/2015-PRN, Disp: , Rfl:  .  ALPRAZolam (XANAX) 0.5 MG tablet, Take 1 tablet (0.5 mg total) by mouth as needed for anxiety. Reported on 04/02/2015, Disp: 30 tablet, Rfl: 0 .  amoxicillin (AMOXIL) 875 MG tablet, Take 1 tablet (875 mg total) by mouth 2 (two) times daily., Disp: 20 tablet, Rfl: 0 .  fexofenadine (ALLEGRA) 180 MG tablet, Take 180 mg by mouth daily., Disp: , Rfl:  .  levothyroxine (SYNTHROID, LEVOTHROID) 100 MCG tablet, Take on an empty stomach with a glass of water. One tab daily except Saturday, one and half tab on Saturday, Disp: , Rfl:  .  montelukast (SINGULAIR) 10 MG tablet, , Disp: , Rfl:  .  Olopatadine HCl (PATADAY) 0.2 % SOLN, INSTILL 1-2 DROPS INTO BOTH EYES DAILY, Disp: , Rfl:  .  predniSONE (DELTASONE) 20 MG tablet, Take daily with food, Disp: 5 tablet, Rfl: 0  No Known Allergies  Objective:   VITALS: Per patient if applicable, see vitals. GENERAL: Alert, appears well and in no acute distress. HEENT: Atraumatic, conjunctiva clear, no obvious abnormalities on inspection of external nose and ears. NECK: Normal movements of the head and neck. CARDIOPULMONARY: No increased WOB. Speaking in clear sentences. I:E ratio WNL.  MS: Moves all visible extremities without noticeable abnormality. PSYCH: Pleasant and cooperative, well-groomed. Speech normal rate and rhythm. Affect is appropriate. Insight and judgement are appropriate. Attention is focused, linear, and appropriate.  NEURO: CN grossly intact. Oriented as arrived to appointment on time with no prompting. Moves both UE equally.  SKIN: No obvious lesions, wounds, erythema, or cyanosis noted on face  or hands.  Assessment and Plan:   Barbara Cardenas was seen today for otalgia.  Diagnoses and all orders for this visit:  Right ear pain No red flags on exam.  Low possibility of COVID-19, however cannot rule out based on symptoms. Will treat for AOM with amoxicillin. Start oral ibuprofen to  help with pain, if no improvement in 1-2 days, may use prednisone. Discussed taking medications as prescribed. Reviewed return precautions including worsening fever, SOB, worsening cough or other concerns. Push fluids and rest. I recommend that patient follow-up if symptoms worsen or persist despite treatment x 7-10 days, sooner if needed.   Other orders -     predniSONE (DELTASONE) 20 MG tablet; Take daily with food -     amoxicillin (AMOXIL) 875 MG tablet; Take 1 tablet (875 mg total) by mouth 2 (two) times daily.    . Reviewed expectations re: course of current medical issues. . Discussed self-management of symptoms. . Outlined signs and symptoms indicating need for more acute intervention. . Patient verbalized understanding and all questions were answered. Marland Kitchen Health Maintenance issues including appropriate healthy diet, exercise, and smoking avoidance were discussed with patient. . See orders for this visit as documented in the electronic medical record.  I discussed the assessment and treatment plan with the patient. The patient was provided an opportunity to ask questions and all were answered. The patient agreed with the plan and demonstrated an understanding of the instructions.   The patient was advised to call back or seek an in-person evaluation if the symptoms worsen or if the condition fails to improve as anticipated.     [place], [place] 06/15/2018

## 2018-06-18 DIAGNOSIS — E890 Postprocedural hypothyroidism: Secondary | ICD-10-CM | POA: Diagnosis not present

## 2018-06-18 DIAGNOSIS — C73 Malignant neoplasm of thyroid gland: Secondary | ICD-10-CM | POA: Diagnosis not present

## 2018-06-24 DIAGNOSIS — J301 Allergic rhinitis due to pollen: Secondary | ICD-10-CM | POA: Diagnosis not present

## 2018-06-24 DIAGNOSIS — K219 Gastro-esophageal reflux disease without esophagitis: Secondary | ICD-10-CM | POA: Diagnosis not present

## 2018-06-24 DIAGNOSIS — J3081 Allergic rhinitis due to animal (cat) (dog) hair and dander: Secondary | ICD-10-CM | POA: Diagnosis not present

## 2018-06-24 DIAGNOSIS — J3089 Other allergic rhinitis: Secondary | ICD-10-CM | POA: Diagnosis not present

## 2018-06-28 DIAGNOSIS — J3081 Allergic rhinitis due to animal (cat) (dog) hair and dander: Secondary | ICD-10-CM | POA: Diagnosis not present

## 2018-06-28 DIAGNOSIS — J301 Allergic rhinitis due to pollen: Secondary | ICD-10-CM | POA: Diagnosis not present

## 2018-06-29 DIAGNOSIS — J3089 Other allergic rhinitis: Secondary | ICD-10-CM | POA: Diagnosis not present

## 2018-07-05 ENCOUNTER — Other Ambulatory Visit: Payer: Self-pay

## 2018-07-06 DIAGNOSIS — J301 Allergic rhinitis due to pollen: Secondary | ICD-10-CM | POA: Diagnosis not present

## 2018-07-06 DIAGNOSIS — J3081 Allergic rhinitis due to animal (cat) (dog) hair and dander: Secondary | ICD-10-CM | POA: Diagnosis not present

## 2018-07-06 DIAGNOSIS — J3089 Other allergic rhinitis: Secondary | ICD-10-CM | POA: Diagnosis not present

## 2018-07-08 DIAGNOSIS — J3089 Other allergic rhinitis: Secondary | ICD-10-CM | POA: Diagnosis not present

## 2018-07-08 DIAGNOSIS — J3081 Allergic rhinitis due to animal (cat) (dog) hair and dander: Secondary | ICD-10-CM | POA: Diagnosis not present

## 2018-07-08 DIAGNOSIS — J301 Allergic rhinitis due to pollen: Secondary | ICD-10-CM | POA: Diagnosis not present

## 2018-07-12 NOTE — Progress Notes (Signed)
Virtual Visit via Video   Due to the COVID-19 pandemic, this visit was completed with telemedicine (audio/video) technology to reduce patient and provider exposure as well as to preserve personal protective equipment.   I connected with Barbara Cardenas by a video enabled telemedicine application and verified that I am speaking with the correct person using two identifiers. Location patient: Home Location provider: [facility], Office Persons participating in the virtual visit: Kiyo R Kamau, Briscoe Deutscher, DO Lonell Grandchild, CMA acting as scribe for Dr. Briscoe Deutscher.   I discussed the limitations of evaluation and management by telemedicine and the availability of in person appointments. The patient expressed understanding and agreed to proceed.  Care Team   Patient Care Team: Briscoe Deutscher, DO as PCP - General (Family Medicine) Asthma, [facility] as Consulting Physician (Allergy and Immunology)  Subjective:   HPI:   Patient would like to have testing for Covid antibodies.  She is concerned that when she had flu it may have been covid.   06/15/18  - Pt c/o right ear pain, started on 3-4 days. Pressure in R eye. Severe seasonal allergies that she takes "loads of medications for". Wears a mask to filter out pollen when she walks. No fever, cough, SOB, chest pain. Hx of sinus infections and ETD. Has only tried tylenol for her symptoms (other than her usual allergy/asthma meds she is currently on.)  She returned on 04/22/2018 -- for worsening cough, repeat chest xray which showed persistent infiltrate and also a focal opacity that may represent mucous plugging. She was started on amoxicillin.  Patient was seen by PCP on 04/20/2018 -- diagnosed with flu A and L lateral lung base PNA. She was started on prednisone, z-pack, tamiflu, and hycodan.  Patient did have lump near sternum, breast tissue. She is up to date with her mammogram. She does not have any tenderness to  the area. She will monitor and if does not go away in a week she will call office and we will order ultrasound.    Patient Active Problem List   Diagnosis Date Noted  . Allergic rhinitis due to animal (cat) (dog) hair and dander 03/08/2018  . GERD (gastroesophageal reflux disease) 03/08/2018  . Mild intermittent asthma 03/08/2018  . Other chronic allergic conjunctivitis 03/08/2018  . Connective tissue disease, undifferentiated ([place]) 08/16/2017  . Dysfunction of right eustachian tube 12/07/2016  . History of thyroid cancer 07/20/2016  . Seasonal allergies 07/20/2016  . 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 Postoperative hypothyroidism 09/07/2015  . Hypercholesteremia 12/21/2013    Social History   Tobacco Use  . Smoking status: Never Smoker  . Smokeless tobacco: Never Used  Substance Use Topics  . Alcohol use: No    Current Outpatient Medications:  .  albuterol (PROAIR HFA) 108 (90 Base) MCG/ACT inhaler, Reported on 06/29/2015-PRN, Disp: , Rfl:  .  ALPRAZolam (XANAX) 0.5 MG tablet, Take 1 tablet (0.5 mg total) by mouth as needed for anxiety. Reported on 04/02/2015, Disp: 30 tablet, Rfl: 0 .  clindamycin-benzoyl peroxide (BENZACLIN) gel, , Disp: , Rfl:  .  fexofenadine (ALLEGRA) 180 MG tablet, Take 180 mg by mouth daily., Disp: , Rfl:  .  ipratropium (ATROVENT) 0.06 % nasal spray, USE 2 SPRAYS IN EACH NOSTRIL 3 TIMES A DAY, Disp: , Rfl:  .  levothyroxine (SYNTHROID, LEVOTHROID) 100 MCG tablet, Take on an empty stomach with a glass of water. One tab daily except Saturday, one and half tab on Saturday, Disp: , Rfl:  .  montelukast (SINGULAIR) 10 MG tablet, , Disp: , Rfl:  .  Olopatadine HCl (PATADAY) 0.2 % SOLN, INSTILL 1-2 DROPS INTO BOTH EYES DAILY, Disp: , Rfl:  .  QNASL 80 MCG/ACT AERS, USE 2 SPRAYS INTO EACH NOSTRIL EVERY DAY, Disp: , Rfl:   No Known Allergies  Objective:   VITALS: Per patient if applicable, see vitals. GENERAL: Alert, appears well and in no acute distress. HEENT:  Atraumatic, conjunctiva clear, no obvious abnormalities on inspection of external nose and ears. NECK: Normal movements of the head and neck. CARDIOPULMONARY: No increased WOB. Speaking in clear sentences. I:E ratio WNL.  MS: Moves all visible extremities without noticeable abnormality. PSYCH: Pleasant and cooperative, well-groomed. Speech normal rate and rhythm. Affect is appropriate. Insight and judgement are appropriate. Attention is focused, linear, and appropriate.  NEURO: CN grossly intact. Oriented as arrived to appointment on time with no prompting. Moves both UE equally.  SKIN: No obvious lesions, wounds, erythema, or cyanosis noted on face or hands.  Depression screen PHQ 2/9 12/02/2016  Decreased Interest 0  Down, Depressed, Hopeless 0  PHQ - 2 Score 0    Assessment and Plan:   Unknown was seen today for follow-up.  Diagnoses and all orders for this visit:  Suspected Covid-19 Virus Infection Comments: 2 months ago.  Orders: -     SAR CoV2 Serology (COVID 19)AB(IGG)IA; Future  Breast nodule Comments: Monitor. Consider ultrasound.   Marland Kitchen COVID-19 Education: The signs and symptoms of COVID-19 were discussed with the patient and how to seek care for testing if needed. The importance of social distancing was discussed today. . Reviewed expectations re: course of current medical issues. . Discussed self-management of symptoms. . Outlined signs and symptoms indicating need for more acute intervention. . Patient verbalized understanding and all questions were answered. Marland Kitchen Health Maintenance issues including appropriate healthy diet, exercise, and smoking avoidance were discussed with patient. . See orders for this visit as documented in the electronic medical record.  Briscoe Deutscher, DO  Records requested if needed. Time spent: 25 minutes, of which >50% was spent in obtaining information about her symptoms, reviewing her previous labs, evaluations, and treatments, counseling her  about her condition (please see the discussed topics above), and developing a plan to further investigate it; she had a number of questions which I addressed.

## 2018-07-13 ENCOUNTER — Ambulatory Visit (INDEPENDENT_AMBULATORY_CARE_PROVIDER_SITE_OTHER): Payer: BLUE CROSS/BLUE SHIELD | Admitting: Family Medicine

## 2018-07-13 ENCOUNTER — Other Ambulatory Visit: Payer: Self-pay

## 2018-07-13 ENCOUNTER — Encounter: Payer: Self-pay | Admitting: Family Medicine

## 2018-07-13 VITALS — Ht 68.0 in | Wt 174.0 lb

## 2018-07-13 DIAGNOSIS — Z20822 Contact with and (suspected) exposure to covid-19: Secondary | ICD-10-CM

## 2018-07-13 DIAGNOSIS — R6889 Other general symptoms and signs: Secondary | ICD-10-CM

## 2018-07-13 DIAGNOSIS — N63 Unspecified lump in unspecified breast: Secondary | ICD-10-CM

## 2018-07-14 ENCOUNTER — Other Ambulatory Visit (INDEPENDENT_AMBULATORY_CARE_PROVIDER_SITE_OTHER): Payer: BLUE CROSS/BLUE SHIELD

## 2018-07-14 DIAGNOSIS — Z20822 Contact with and (suspected) exposure to covid-19: Secondary | ICD-10-CM

## 2018-07-14 DIAGNOSIS — R6889 Other general symptoms and signs: Secondary | ICD-10-CM

## 2018-07-15 DIAGNOSIS — J3081 Allergic rhinitis due to animal (cat) (dog) hair and dander: Secondary | ICD-10-CM | POA: Diagnosis not present

## 2018-07-15 DIAGNOSIS — J3089 Other allergic rhinitis: Secondary | ICD-10-CM | POA: Diagnosis not present

## 2018-07-15 DIAGNOSIS — J301 Allergic rhinitis due to pollen: Secondary | ICD-10-CM | POA: Diagnosis not present

## 2018-07-15 LAB — SAR COV2 SEROLOGY (COVID19)AB(IGG),IA: SARS CoV2 AB IGG: NEGATIVE

## 2018-07-23 ENCOUNTER — Encounter: Payer: Self-pay | Admitting: Physician Assistant

## 2018-07-23 ENCOUNTER — Ambulatory Visit (INDEPENDENT_AMBULATORY_CARE_PROVIDER_SITE_OTHER): Payer: BLUE CROSS/BLUE SHIELD | Admitting: Physician Assistant

## 2018-07-23 DIAGNOSIS — R05 Cough: Secondary | ICD-10-CM

## 2018-07-23 DIAGNOSIS — R059 Cough, unspecified: Secondary | ICD-10-CM

## 2018-07-23 MED ORDER — AZITHROMYCIN 250 MG PO TABS: ORAL_TABLET | ORAL | 0 refills | Status: DC

## 2018-07-23 NOTE — Progress Notes (Signed)
Virtual Visit via Video   I connected with Barbara Cardenas on 07/23/18 at  9:20 AM EDT by a video enabled telemedicine application and verified that I am speaking with the correct person using two identifiers. Location patient: Home Location provider: [facility], Office Persons participating in the virtual visit: Barbara Cardenas, Reiley Bertagnolli PA-C,Donna [place], LPN   I discussed the limitations of evaluation and management by telemedicine and the availability of in person appointments. The patient expressed understanding and agreed to proceed.  I acted as a Education administrator for [employer], PA-C [employer], LPN  Subjective:   HPI: Cough Pt said her allergies are bothering her was better for little bit after last round of medication and symptoms are back c/o cough x 3 weeks, expectorating pale yellow mucus, nasal congestion clare drainage, sinus pressure, headache, ear pain R>L. Denies fever. She is using Ibuprofen 400 mg TID, little relief. She is currently in [place] but is traveling back home tomorrow.   07/13/18 -- Patient wanted testing for COVID antibodies.  Performed 07/14/18 and was negative.  06/15/18  - Pt c/o right ear pain, started on3-4 days. Pressure in R eye. Severe seasonal allergies that she takes "loads of medications for". Wears a mask to filter out pollen when she walks. No fever, cough, SOB, chest pain. Hx of sinus infections and ETD. Has only tried tylenol for her symptoms (other than her usual allergy/asthma meds she is currently on.)  She returned on 04/22/2018 -- for worsening cough, repeat chest xray which showed persistent infiltrate and also a focal opacity that may represent mucous plugging. She was started on amoxicillin.  Patient was seen by PCP on 04/20/2018 -- diagnosed with flu A and L lateral lung base PNA. She was started on prednisone, z-pack, tamiflu, and hycodan.  ROS: See pertinent positives and negatives per HPI.  Patient Active  Problem List   Diagnosis Date Noted  . Allergic rhinitis due to animal (cat) (dog) hair and dander 03/08/2018  . GERD (gastroesophageal reflux disease) 03/08/2018  . Mild intermittent asthma 03/08/2018  . Other chronic allergic conjunctivitis 03/08/2018  . Connective tissue disease, undifferentiated ([place]) 08/16/2017  . Dysfunction of right eustachian tube 12/07/2016  . History of thyroid cancer 07/20/2016  . Seasonal allergies 07/20/2016  . 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 Postoperative hypothyroidism 09/07/2015  . Hypercholesteremia 12/21/2013    Social History   Tobacco Use  . Smoking status: Never Smoker  . Smokeless tobacco: Never Used  Substance Use Topics  . Alcohol use: No    Current Outpatient Medications:  .  albuterol (PROAIR HFA) 108 (90 Base) MCG/ACT inhaler, Reported on 06/29/2015-PRN, Disp: , Rfl:  .  ALPRAZolam (XANAX) 0.5 MG tablet, Take 1 tablet (0.5 mg total) by mouth as needed for anxiety. Reported on 04/02/2015, Disp: 30 tablet, Rfl: 0 .  clindamycin-benzoyl peroxide (BENZACLIN) gel, , Disp: , Rfl:  .  fexofenadine (ALLEGRA) 180 MG tablet, Take 180 mg by mouth daily., Disp: , Rfl:  .  ipratropium (ATROVENT) 0.06 % nasal spray, USE 2 SPRAYS IN EACH NOSTRIL 3 TIMES A DAY, Disp: , Rfl:  .  levothyroxine (SYNTHROID, LEVOTHROID) 100 MCG tablet, Take 100 mcg by mouth daily before breakfast. , Disp: , Rfl:  .  montelukast (SINGULAIR) 10 MG tablet, , Disp: , Rfl:  .  Olopatadine HCl (PATADAY) 0.2 % SOLN, INSTILL 1-2 DROPS INTO BOTH EYES DAILY, Disp: , Rfl:  .  QNASL 80 MCG/ACT AERS, USE 2 SPRAYS INTO EACH NOSTRIL  EVERY DAY, Disp: , Rfl:  .  azithromycin (ZITHROMAX) 250 MG tablet, Take two tablets on day 1, then one tablet daily thereafter, Disp: 6 tablet, Rfl: 0  No Known Allergies  Objective:   VITALS: Per patient if applicable, see vitals. GENERAL: Alert, appears well and in no acute distress. HEENT: Atraumatic, conjunctiva clear, no obvious abnormalities on inspection  of external nose and ears. NECK: Normal movements of the head and neck. CARDIOPULMONARY: No increased WOB. Speaking in clear sentences. I:E ratio WNL.  MS: Moves all visible extremities without noticeable abnormality. PSYCH: Pleasant and cooperative, well-groomed. Speech normal rate and rhythm. Affect is appropriate. Insight and judgement are appropriate. Attention is focused, linear, and appropriate.  NEURO: CN grossly intact. Oriented as arrived to appointment on time with no prompting. Moves both UE equally.  SKIN: No obvious lesions, wounds, erythema, or cyanosis noted on face or hands.  Assessment and Plan:   Trysten was seen today for cough.  Diagnoses and all orders for this visit:  Cough Given sinus pressure, cough, R ear pain will treat with azithromycin per orders for possible acute otitis media and acute bacterial sinusitis. I did discuss with her that I cannot rule out COVID-19 with her symptoms so I recommended conservative measures such as face masks, frequent hand hygiene and social distancing as able. Worsening precautions advised.   Other orders -     azithromycin (ZITHROMAX) 250 MG tablet; Take two tablets on day 1, then one tablet daily thereafter   . Reviewed expectations re: course of current medical issues. . Discussed self-management of symptoms. . Outlined signs and symptoms indicating need for more acute intervention. . Patient verbalized understanding and all questions were answered. Marland Kitchen Health Maintenance issues including appropriate healthy diet, exercise, and smoking avoidance were discussed with patient. . See orders for this visit as documented in the electronic medical record.  I discussed the assessment and treatment plan with the patient. The patient was provided an opportunity to ask questions and all were answered. The patient agreed with the plan and demonstrated an understanding of the instructions.   The patient was advised to call back or seek an  in-person evaluation if the symptoms worsen or if the condition fails to improve as anticipated.   CMA or LPN served as scribe during this visit. History, Physical, and Plan performed by medical provider. The above documentation has been reviewed and is accurate and complete.   [place], [place] 07/23/2018

## 2018-07-30 DIAGNOSIS — J301 Allergic rhinitis due to pollen: Secondary | ICD-10-CM | POA: Diagnosis not present

## 2018-07-30 DIAGNOSIS — J3089 Other allergic rhinitis: Secondary | ICD-10-CM | POA: Diagnosis not present

## 2018-07-30 DIAGNOSIS — J3081 Allergic rhinitis due to animal (cat) (dog) hair and dander: Secondary | ICD-10-CM | POA: Diagnosis not present

## 2018-08-04 DIAGNOSIS — J3081 Allergic rhinitis due to animal (cat) (dog) hair and dander: Secondary | ICD-10-CM | POA: Diagnosis not present

## 2018-08-04 DIAGNOSIS — J3089 Other allergic rhinitis: Secondary | ICD-10-CM | POA: Diagnosis not present

## 2018-08-04 DIAGNOSIS — J301 Allergic rhinitis due to pollen: Secondary | ICD-10-CM | POA: Diagnosis not present

## 2018-08-06 DIAGNOSIS — L723 Sebaceous cyst: Secondary | ICD-10-CM | POA: Diagnosis not present

## 2018-08-06 DIAGNOSIS — N951 Menopausal and female climacteric states: Secondary | ICD-10-CM | POA: Diagnosis not present

## 2018-08-11 DIAGNOSIS — J3081 Allergic rhinitis due to animal (cat) (dog) hair and dander: Secondary | ICD-10-CM | POA: Diagnosis not present

## 2018-08-11 DIAGNOSIS — J301 Allergic rhinitis due to pollen: Secondary | ICD-10-CM | POA: Diagnosis not present

## 2018-08-11 DIAGNOSIS — J3089 Other allergic rhinitis: Secondary | ICD-10-CM | POA: Diagnosis not present

## 2018-08-16 ENCOUNTER — Ambulatory Visit
Admission: RE | Admit: 2018-08-16 | Discharge: 2018-08-16 | Disposition: A | Discharge status: Home / Self Care | Payer: BC Managed Care – PPO | Source: Ambulatory Visit | Attending: Physician Assistant | Admitting: Physician Assistant

## 2018-08-16 ENCOUNTER — Other Ambulatory Visit: Payer: Self-pay

## 2018-08-16 DIAGNOSIS — J181 Lobar pneumonia, unspecified organism: Secondary | ICD-10-CM | POA: Diagnosis not present

## 2018-08-16 DIAGNOSIS — J189 Pneumonia, unspecified organism: Secondary | ICD-10-CM

## 2018-08-17 ENCOUNTER — Other Ambulatory Visit: Payer: Self-pay | Admitting: *Deleted

## 2018-08-17 DIAGNOSIS — R599 Enlarged lymph nodes, unspecified: Secondary | ICD-10-CM

## 2018-08-17 DIAGNOSIS — R918 Other nonspecific abnormal finding of lung field: Secondary | ICD-10-CM

## 2018-08-18 DIAGNOSIS — J301 Allergic rhinitis due to pollen: Secondary | ICD-10-CM | POA: Diagnosis not present

## 2018-08-18 DIAGNOSIS — J3081 Allergic rhinitis due to animal (cat) (dog) hair and dander: Secondary | ICD-10-CM | POA: Diagnosis not present

## 2018-08-18 DIAGNOSIS — J3089 Other allergic rhinitis: Secondary | ICD-10-CM | POA: Diagnosis not present

## 2018-08-20 DIAGNOSIS — J3089 Other allergic rhinitis: Secondary | ICD-10-CM | POA: Diagnosis not present

## 2018-08-20 DIAGNOSIS — J301 Allergic rhinitis due to pollen: Secondary | ICD-10-CM | POA: Diagnosis not present

## 2018-08-20 DIAGNOSIS — J3081 Allergic rhinitis due to animal (cat) (dog) hair and dander: Secondary | ICD-10-CM | POA: Diagnosis not present

## 2018-08-23 ENCOUNTER — Other Ambulatory Visit: Payer: Self-pay

## 2018-08-23 ENCOUNTER — Encounter: Payer: Self-pay | Admitting: Emergency Medicine

## 2018-08-23 ENCOUNTER — Ambulatory Visit (INDEPENDENT_AMBULATORY_CARE_PROVIDER_SITE_OTHER): Payer: BC Managed Care – PPO | Admitting: Emergency Medicine

## 2018-08-23 DIAGNOSIS — J4520 Mild intermittent asthma, uncomplicated: Secondary | ICD-10-CM

## 2018-08-23 DIAGNOSIS — R9389 Abnormal findings on diagnostic imaging of other specified body structures: Secondary | ICD-10-CM

## 2018-08-23 DIAGNOSIS — J302 Other seasonal allergic rhinitis: Secondary | ICD-10-CM

## 2018-08-23 DIAGNOSIS — J3081 Allergic rhinitis due to animal (cat) (dog) hair and dander: Secondary | ICD-10-CM | POA: Diagnosis not present

## 2018-08-23 NOTE — Assessment & Plan Note (Signed)
Significant allergic rhinitis, follows with Dr. Remus Blake and is on immunotherapy.

## 2018-08-23 NOTE — Assessment & Plan Note (Signed)
On immunotherapy, following with Dr. Remus Blake

## 2018-08-23 NOTE — Assessment & Plan Note (Signed)
We will likely repeat her pulmonary function testing at some point going forward to quantify her degree of obstruction.  Her principal symptom of chronic cough is likely mainly upper airway in nature due to her significant allergies.  She does benefit intermittently from albuterol.  Following with Dr. Remus Blake

## 2018-08-23 NOTE — H&P (View-Only) (Signed)
Subjective:    Patient ID: Barbara Cardenas, female    DOB: 10-29-1970, 48 y.o.   MRN: [id number]  HPI 48 year old never smoker with history of papillary thyroid cancer treated surgically, fairly severe seasonal allergies, hyperlipidemia.  She carries a diagnosis of mild intermittent asthma that was made 2015, has seen Dr Orvil Feil. She is on immunotherapy.   She has been quite symptomatic this year, was diagnosed with influenza and a left lower lobe community-acquired pneumonia in February.  She had persistent cough even after antibiotic treatment.  Her allergies were active through the spring, treated for possible sinusitis then and again more recently 1 month ago.  A CT chest was done on 08/16/2018 given the recurrent / persistent symptoms and the persistent infiltrate on chest x-ray from February.  I reviewed, this shows a small focus of left basilar subsegmental atelectasis or scar, some mildly enlarged mediastinal lymphadenopathy with calcifications, cluster of small rounded calcifications in the right upper lobe.     Review of Systems  Constitutional: Negative for fever and unexpected weight change.  HENT: Negative for congestion, dental problem, ear pain, nosebleeds, postnasal drip, rhinorrhea, sinus pressure, sneezing, sore throat and trouble swallowing.   Eyes: Negative for redness and itching.  Respiratory: Positive for cough. Negative for chest tightness, shortness of breath and wheezing.   Cardiovascular: Negative for palpitations and leg swelling.  Gastrointestinal: Negative for nausea and vomiting.  Genitourinary: Negative for dysuria.  Musculoskeletal: Negative for joint swelling.  Skin: Negative for rash.  Neurological: Negative for headaches.  Hematological: Does not bruise/bleed easily.  Psychiatric/Behavioral: Negative for dysphoric mood. The patient is not nervous/anxious.    Past Medical History:  Diagnosis Date  . Cancer of thyroid ([facility]) 09/07/2015  . 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 Hypercholesteremia 12/21/2013  . Neuropathy   . Postoperative hypothyroidism 09/07/2015  . Seasonal allergies 07/20/2016  . Thyroid cancer ([facility])   . Vision abnormalities      Family History  Problem Relation Age of Onset  . Healthy Mother   . CAD Father   . Allergies Father   . Ulcerative colitis Father   . Tremor Father   . Neuropathy Neg Hx      Social History   Socioeconomic History  . Marital status: Married    Spouse name: Not on file  . Number of children: 3  . Years of education: Not on file  . Highest education level: Not on file  Occupational History  . 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 Financial resource strain: Not on file  . Food insecurity    Worry: Not on file    Inability: Not on file  . Transportation needs    Medical: Not on file    Non-medical: Not on file  Tobacco Use  . Smoking status: Never Smoker  . Smokeless tobacco: Never Used  Substance and Sexual Activity  . Alcohol use: No  . Drug use: No  . Sexual activity: Not on file  Lifestyle  . Physical activity    Days per week: Not on file    Minutes per session: Not on file  . Stress: Not on file  Relationships  . Social Herbalist on phone: Not on file    Gets together: Not on file    Attends religious service: Not on file    Active member of club or organization: Not on file    Attends meetings of clubs or organizations: Not on file  Relationship status: Not on file  . Intimate partner violence    Fear of current or ex partner: Not on file    Emotionally abused: Not on file    Physically abused: Not on file    Forced sexual activity: Not on file  Other Topics Concern  . Not on file  Social History Narrative   Mother of three. Husband works out of town often. She eats well and exercises regularly.   not working, did grow up on a farm From [place], has lived in [place], never [place].  Worked Psychologist, counselling  No Known Allergies   Outpatient Medications Prior to  Visit  Medication Sig Dispense Refill  . albuterol (PROAIR HFA) 108 (90 Base) MCG/ACT inhaler Reported on 06/29/2015-PRN    . ALPRAZolam (XANAX) 0.5 MG tablet Take 1 tablet (0.5 mg total) by mouth as needed for anxiety. Reported on 04/02/2015 30 tablet 0  . clindamycin-benzoyl peroxide (BENZACLIN) gel     . fexofenadine (ALLEGRA) 180 MG tablet Take 180 mg by mouth daily.    Marland Kitchen ipratropium (ATROVENT) 0.06 % nasal spray USE 2 SPRAYS IN EACH NOSTRIL 3 TIMES A DAY    . levothyroxine (SYNTHROID, LEVOTHROID) 100 MCG tablet Take 100 mcg by mouth daily before breakfast.     . montelukast (SINGULAIR) 10 MG tablet     . Olopatadine HCl (PATADAY) 0.2 % SOLN INSTILL 1-2 DROPS INTO BOTH EYES DAILY    . QNASL 80 MCG/ACT AERS USE 2 SPRAYS INTO EACH NOSTRIL EVERY DAY    . azithromycin (ZITHROMAX) 250 MG tablet Take two tablets on day 1, then one tablet daily thereafter 6 tablet 0   No facility-administered medications prior to visit.         Objective:   Physical Exam Vitals:   08/23/18 1104  BP: 118/76  Pulse: 100  SpO2: 98%  Weight: 167 lb (75.8 kg)  Height: 5\' 8"  (1.727 m)   Gen: Pleasant, well-nourished, in no distress,  normal affect  ENT: No lesions,  mouth clear,  oropharynx clear, no postnasal drip  Neck: No JVD, no stridor  Lungs: No use of accessory muscles, no crackles or wheezing on normal respiration, no wheeze on forced expiration  Cardiovascular: RRR, heart sounds normal, no murmur or gallops, no peripheral edema  Musculoskeletal: No deformities, no cyanosis or clubbing  Neuro: alert, awake, non focal  Skin: Warm, no lesions or rash     Assessment & Plan:  Mild intermittent asthma We will likely repeat her pulmonary function testing at some point going forward to quantify her degree of obstruction.  Her principal symptom of chronic cough is likely mainly upper airway in nature due to her significant allergies.  She does benefit intermittently from albuterol.  Following with  Dr. Remus Blake  Allergic rhinitis due to animal (cat) (dog) hair and dander On immunotherapy, following with Dr. Remus Blake  Abnormal CT of the chest With mediastinal lymphadenopathy partially calcified, some calcified areas in the right upper lobe suggestive of chronic granulomatous disease, histoplasmosis or fungal exposure.  Reviewed the films and differential diagnosis with her.  Consider sarcoidosis.  I think it is unlikely that this reflects lymphoma given the calcifications, even less likely metastatic thyroid carcinoma.  No history to suggest TB or atypical mycobacterial disease.  We discussed watchful waiting versus lymph node biopsy via endobronchial ultrasound.  I think that she is leaning towards obtaining a tissue diagnosis.  She is going to speak with her family about this and then we  will plan a procedure if all agree.   Please continue your allergy shots and your allergy medications as you are taking them  We discussed your CT scan of the chest today.  There are enlarged lymph nodes and areas consistent with chronic inflammation present.  We discussed possible bronchoscopy to further examine the lymph nodes.  Please call our office after event updated discussed with her family regarding future work-up. Follow with Dr Lamonte Sakai in 6 weeks or sooner if you have any problems  Seasonal allergies Significant allergic rhinitis, follows with Dr. Remus Blake and is on immunotherapy.  Baltazar Apo, MD, PhD 08/23/2018, 2:06 PM [facility] [phone] or if no answer [phone]

## 2018-08-23 NOTE — Assessment & Plan Note (Addendum)
With mediastinal lymphadenopathy partially calcified, some calcified areas in the right upper lobe suggestive of chronic granulomatous disease, histoplasmosis or fungal exposure.  Reviewed the films and differential diagnosis with her.  Consider sarcoidosis.  I think it is unlikely that this reflects lymphoma given the calcifications, even less likely metastatic thyroid carcinoma.  No history to suggest TB or atypical mycobacterial disease.  We discussed watchful waiting versus lymph node biopsy via endobronchial ultrasound.  I think that she is leaning towards obtaining a tissue diagnosis.  She is going to speak with her family about this and then we will plan a procedure if all agree.   Please continue your allergy shots and your allergy medications as you are taking them  We discussed your CT scan of the chest today.  There are enlarged lymph nodes and areas consistent with chronic inflammation present.  We discussed possible bronchoscopy to further examine the lymph nodes.  Please call our office after event updated discussed with her family regarding future work-up. Follow with Dr Lamonte Sakai in 6 weeks or sooner if you have any problems

## 2018-08-23 NOTE — Patient Instructions (Signed)
Please continue your allergy shots and your allergy medications as you are taking them  We discussed your CT scan of the chest today.  There are enlarged lymph nodes and areas consistent with chronic inflammation present.  We discussed possible bronchoscopy to further examine the lymph nodes.  Please call our office after event updated discussed with her family regarding future work-up. Follow with Dr Lamonte Sakai in 6 weeks or sooner if you have any problems

## 2018-08-23 NOTE — Progress Notes (Signed)
Subjective:    Patient ID: Barbara Cardenas, female    DOB: 1970/11/02, 48 y.o.   MRN: [id number]  HPI 48 year old never smoker with history of papillary thyroid cancer treated surgically, fairly severe seasonal allergies, hyperlipidemia.  She carries a diagnosis of mild intermittent asthma that was made 2015, has seen Dr Orvil Feil. She is on immunotherapy.   She has been quite symptomatic this year, was diagnosed with influenza and a left lower lobe community-acquired pneumonia in February.  She had persistent cough even after antibiotic treatment.  Her allergies were active through the spring, treated for possible sinusitis then and again more recently 1 month ago.  A CT chest was done on 08/16/2018 given the recurrent / persistent symptoms and the persistent infiltrate on chest x-ray from February.  I reviewed, this shows a small focus of left basilar subsegmental atelectasis or scar, some mildly enlarged mediastinal lymphadenopathy with calcifications, cluster of small rounded calcifications in the right upper lobe.     Review of Systems  Constitutional: Negative for fever and unexpected weight change.  HENT: Negative for congestion, dental problem, ear pain, nosebleeds, postnasal drip, rhinorrhea, sinus pressure, sneezing, sore throat and trouble swallowing.   Eyes: Negative for redness and itching.  Respiratory: Positive for cough. Negative for chest tightness, shortness of breath and wheezing.   Cardiovascular: Negative for palpitations and leg swelling.  Gastrointestinal: Negative for nausea and vomiting.  Genitourinary: Negative for dysuria.  Musculoskeletal: Negative for joint swelling.  Skin: Negative for rash.  Neurological: Negative for headaches.  Hematological: Does not bruise/bleed easily.  Psychiatric/Behavioral: Negative for dysphoric mood. The patient is not nervous/anxious.    Past Medical History:  Diagnosis Date  . Cancer of thyroid ([facility]) 09/07/2015  . 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 Hypercholesteremia 12/21/2013  . Neuropathy   . Postoperative hypothyroidism 09/07/2015  . Seasonal allergies 07/20/2016  . Thyroid cancer ([facility])   . Vision abnormalities      Family History  Problem Relation Age of Onset  . Healthy Mother   . CAD Father   . Allergies Father   . Ulcerative colitis Father   . Tremor Father   . Neuropathy Neg Hx      Social History   Socioeconomic History  . Marital status: Married    Spouse name: Not on file  . Number of children: 3  . Years of education: Not on file  . Highest education level: Not on file  Occupational History  . 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 Financial resource strain: Not on file  . Food insecurity    Worry: Not on file    Inability: Not on file  . Transportation needs    Medical: Not on file    Non-medical: Not on file  Tobacco Use  . Smoking status: Never Smoker  . Smokeless tobacco: Never Used  Substance and Sexual Activity  . Alcohol use: No  . Drug use: No  . Sexual activity: Not on file  Lifestyle  . Physical activity    Days per week: Not on file    Minutes per session: Not on file  . Stress: Not on file  Relationships  . Social Herbalist on phone: Not on file    Gets together: Not on file    Attends religious service: Not on file    Active member of club or organization: Not on file    Attends meetings of clubs or organizations: Not on file  Relationship status: Not on file  . Intimate partner violence    Fear of current or ex partner: Not on file    Emotionally abused: Not on file    Physically abused: Not on file    Forced sexual activity: Not on file  Other Topics Concern  . Not on file  Social History Narrative   Mother of three. Husband works out of town often. She eats well and exercises regularly.   not working, did grow up on a farm From [place], has lived in [place], never [place].  Worked Psychologist, counselling  No Known Allergies   Outpatient Medications Prior to  Visit  Medication Sig Dispense Refill  . albuterol (PROAIR HFA) 108 (90 Base) MCG/ACT inhaler Reported on 06/29/2015-PRN    . ALPRAZolam (XANAX) 0.5 MG tablet Take 1 tablet (0.5 mg total) by mouth as needed for anxiety. Reported on 04/02/2015 30 tablet 0  . clindamycin-benzoyl peroxide (BENZACLIN) gel     . fexofenadine (ALLEGRA) 180 MG tablet Take 180 mg by mouth daily.    Marland Kitchen ipratropium (ATROVENT) 0.06 % nasal spray USE 2 SPRAYS IN EACH NOSTRIL 3 TIMES A DAY    . levothyroxine (SYNTHROID, LEVOTHROID) 100 MCG tablet Take 100 mcg by mouth daily before breakfast.     . montelukast (SINGULAIR) 10 MG tablet     . Olopatadine HCl (PATADAY) 0.2 % SOLN INSTILL 1-2 DROPS INTO BOTH EYES DAILY    . QNASL 80 MCG/ACT AERS USE 2 SPRAYS INTO EACH NOSTRIL EVERY DAY    . azithromycin (ZITHROMAX) 250 MG tablet Take two tablets on day 1, then one tablet daily thereafter 6 tablet 0   No facility-administered medications prior to visit.         Objective:   Physical Exam Vitals:   08/23/18 1104  BP: 118/76  Pulse: 100  SpO2: 98%  Weight: 167 lb (75.8 kg)  Height: 5\' 8"  (1.727 m)   Gen: Pleasant, well-nourished, in no distress,  normal affect  ENT: No lesions,  mouth clear,  oropharynx clear, no postnasal drip  Neck: No JVD, no stridor  Lungs: No use of accessory muscles, no crackles or wheezing on normal respiration, no wheeze on forced expiration  Cardiovascular: RRR, heart sounds normal, no murmur or gallops, no peripheral edema  Musculoskeletal: No deformities, no cyanosis or clubbing  Neuro: alert, awake, non focal  Skin: Warm, no lesions or rash     Assessment & Plan:  Mild intermittent asthma We will likely repeat her pulmonary function testing at some point going forward to quantify her degree of obstruction.  Her principal symptom of chronic cough is likely mainly upper airway in nature due to her significant allergies.  She does benefit intermittently from albuterol.  Following with  Dr. Remus Blake  Allergic rhinitis due to animal (cat) (dog) hair and dander On immunotherapy, following with Dr. Remus Blake  Abnormal CT of the chest With mediastinal lymphadenopathy partially calcified, some calcified areas in the right upper lobe suggestive of chronic granulomatous disease, histoplasmosis or fungal exposure.  Reviewed the films and differential diagnosis with her.  Consider sarcoidosis.  I think it is unlikely that this reflects lymphoma given the calcifications, even less likely metastatic thyroid carcinoma.  No history to suggest TB or atypical mycobacterial disease.  We discussed watchful waiting versus lymph node biopsy via endobronchial ultrasound.  I think that she is leaning towards obtaining a tissue diagnosis.  She is going to speak with her family about this and then we  will plan a procedure if all agree.   Please continue your allergy shots and your allergy medications as you are taking them  We discussed your CT scan of the chest today.  There are enlarged lymph nodes and areas consistent with chronic inflammation present.  We discussed possible bronchoscopy to further examine the lymph nodes.  Please call our office after event updated discussed with her family regarding future work-up. Follow with Dr Lamonte Sakai in 6 weeks or sooner if you have any problems  Seasonal allergies Significant allergic rhinitis, follows with Dr. Remus Blake and is on immunotherapy.  Baltazar Apo, MD, PhD 08/23/2018, 2:06 PM  [facility] [phone] or if no answer [phone]

## 2018-08-25 DIAGNOSIS — J3081 Allergic rhinitis due to animal (cat) (dog) hair and dander: Secondary | ICD-10-CM | POA: Diagnosis not present

## 2018-08-25 DIAGNOSIS — J301 Allergic rhinitis due to pollen: Secondary | ICD-10-CM | POA: Diagnosis not present

## 2018-08-25 DIAGNOSIS — J3089 Other allergic rhinitis: Secondary | ICD-10-CM | POA: Diagnosis not present

## 2018-08-26 ENCOUNTER — Telehealth: Payer: Self-pay | Admitting: Emergency Medicine

## 2018-08-26 DIAGNOSIS — R9389 Abnormal findings on diagnostic imaging of other specified body structures: Secondary | ICD-10-CM

## 2018-08-26 NOTE — Telephone Encounter (Signed)
Call returned to patient, she states she was calling back with scheduling info for a Bronch. She states her husband is available July 15th and July 22nd.   RB please advise. If these dates work we can go ahead and place order for [employer]. If so what diagnosis would you like to use for the order? Thanks.

## 2018-08-26 NOTE — Telephone Encounter (Signed)
Please see if we can schedule EBUS at [facility] or [facility] on 7/15. Note I already have a case 7/15 at 8:30 at [facility], could do this to follow. Lib[facility] and the PCCs should be able to help schedule.

## 2018-08-26 NOTE — Telephone Encounter (Signed)
Left detailed message for patient explaining that [facility] stated 7/15 would be ok. Will go ahead and place order for PCCs.

## 2018-08-26 NOTE — Telephone Encounter (Signed)
Call returned to patient, made aware we have placed the order and our PCC's are working to get her scheduled. Once we have the appt date and time confirmed we would call her back. Voiced understanding. Will close this message as their are multiple messages open regarding the same thing. Nothing further needed at this time.

## 2018-08-30 DIAGNOSIS — J301 Allergic rhinitis due to pollen: Secondary | ICD-10-CM | POA: Diagnosis not present

## 2018-08-30 DIAGNOSIS — J3089 Other allergic rhinitis: Secondary | ICD-10-CM | POA: Diagnosis not present

## 2018-08-30 DIAGNOSIS — J3081 Allergic rhinitis due to animal (cat) (dog) hair and dander: Secondary | ICD-10-CM | POA: Diagnosis not present

## 2018-09-01 DIAGNOSIS — J301 Allergic rhinitis due to pollen: Secondary | ICD-10-CM | POA: Diagnosis not present

## 2018-09-01 DIAGNOSIS — J3089 Other allergic rhinitis: Secondary | ICD-10-CM | POA: Diagnosis not present

## 2018-09-01 DIAGNOSIS — J3081 Allergic rhinitis due to animal (cat) (dog) hair and dander: Secondary | ICD-10-CM | POA: Diagnosis not present

## 2018-09-04 ENCOUNTER — Other Ambulatory Visit (HOSPITAL_COMMUNITY)
Admission: RE | Admit: 2018-09-04 | Discharge: 2018-09-04 | Disposition: A | Discharge status: Home / Self Care | Payer: BC Managed Care – PPO | Source: Ambulatory Visit | Attending: Emergency Medicine | Admitting: Emergency Medicine

## 2018-09-04 DIAGNOSIS — Z01812 Encounter for preprocedural laboratory examination: Secondary | ICD-10-CM | POA: Diagnosis not present

## 2018-09-04 DIAGNOSIS — Z1159 Encounter for screening for other viral diseases: Secondary | ICD-10-CM | POA: Insufficient documentation

## 2018-09-04 LAB — SARS CORONAVIRUS 2 (TAT 6-24 HRS): SARS Coronavirus 2: NEGATIVE

## 2018-09-07 ENCOUNTER — Other Ambulatory Visit: Payer: Self-pay

## 2018-09-07 ENCOUNTER — Encounter (HOSPITAL_COMMUNITY): Payer: Self-pay | Admitting: *Deleted

## 2018-09-07 NOTE — Progress Notes (Signed)
Pt denies SOB, chest pain, and being under the care of a cardiologist. Pt denies having a stress test, echo and cardiac cath. Pt denies having an EKG within the last year. Pt denies recent labs. Pt made aware to stop taking Sudafed,  Aspirin (unless otherwise advised by surgeon), vitamins, fish oil and herbal medications. Do not take any NSAIDs ie: Ibuprofen, Advil, Naproxen (Aleve), Motrin, BC and Goody Powder.  Pt denies that she and family members tested positive for COVID-19 ( pt tested on 09/04/18 and reminded to quarantine).   Coronavirus Screening  Pt denies that she ad family members experienced the following symptoms:  Cough yes/no: No Fever (>100.26F)  yes/no: No Runny nose yes/no: No Sore throat yes/no: No Difficulty breathing/shortness of breath  yes/no: No  Have you or a family member traveled in the last 14 days and where? yes/no: No  Pt reminded that hospital visitation restrictions are in effect and the importance of the restrictions.   Pt verbalized understanding of all pre-op instructions.

## 2018-09-08 ENCOUNTER — Ambulatory Visit (HOSPITAL_COMMUNITY): Payer: BC Managed Care – PPO | Admitting: Certified Registered Nurse Anesthetist

## 2018-09-08 ENCOUNTER — Encounter (HOSPITAL_COMMUNITY)
Admission: RE | Disposition: A | Discharge status: Home / Self Care | Payer: Self-pay | Source: Home / Self Care | Attending: Emergency Medicine

## 2018-09-08 ENCOUNTER — Encounter (HOSPITAL_COMMUNITY): Payer: Self-pay | Admitting: Surgery

## 2018-09-08 ENCOUNTER — Ambulatory Visit (HOSPITAL_COMMUNITY)
Admission: RE | Admit: 2018-09-08 | Discharge: 2018-09-08 | Disposition: A | Discharge status: Home / Self Care | Payer: BC Managed Care – PPO | Attending: Emergency Medicine | Admitting: Emergency Medicine

## 2018-09-08 DIAGNOSIS — F419 Anxiety disorder, unspecified: Secondary | ICD-10-CM | POA: Insufficient documentation

## 2018-09-08 DIAGNOSIS — R51 Headache: Secondary | ICD-10-CM | POA: Insufficient documentation

## 2018-09-08 DIAGNOSIS — R590 Localized enlarged lymph nodes: Secondary | ICD-10-CM | POA: Diagnosis present

## 2018-09-08 DIAGNOSIS — E7800 Pure hypercholesterolemia, unspecified: Secondary | ICD-10-CM | POA: Insufficient documentation

## 2018-09-08 DIAGNOSIS — J4520 Mild intermittent asthma, uncomplicated: Secondary | ICD-10-CM | POA: Insufficient documentation

## 2018-09-08 DIAGNOSIS — R9389 Abnormal findings on diagnostic imaging of other specified body structures: Secondary | ICD-10-CM | POA: Diagnosis present

## 2018-09-08 DIAGNOSIS — Z820 Family history of epilepsy and other diseases of the nervous system: Secondary | ICD-10-CM | POA: Diagnosis not present

## 2018-09-08 DIAGNOSIS — Z8249 Family history of ischemic heart disease and other diseases of the circulatory system: Secondary | ICD-10-CM | POA: Diagnosis not present

## 2018-09-08 DIAGNOSIS — Z79899 Other long term (current) drug therapy: Secondary | ICD-10-CM | POA: Diagnosis not present

## 2018-09-08 DIAGNOSIS — G629 Polyneuropathy, unspecified: Secondary | ICD-10-CM | POA: Insufficient documentation

## 2018-09-08 DIAGNOSIS — Z7951 Long term (current) use of inhaled steroids: Secondary | ICD-10-CM | POA: Insufficient documentation

## 2018-09-08 DIAGNOSIS — Z8379 Family history of other diseases of the digestive system: Secondary | ICD-10-CM | POA: Diagnosis not present

## 2018-09-08 DIAGNOSIS — E890 Postprocedural hypothyroidism: Secondary | ICD-10-CM | POA: Diagnosis not present

## 2018-09-08 DIAGNOSIS — Z85850 Personal history of malignant neoplasm of thyroid: Secondary | ICD-10-CM | POA: Insufficient documentation

## 2018-09-08 DIAGNOSIS — J40 Bronchitis, not specified as acute or chronic: Secondary | ICD-10-CM | POA: Diagnosis not present

## 2018-09-08 DIAGNOSIS — K219 Gastro-esophageal reflux disease without esophagitis: Secondary | ICD-10-CM | POA: Diagnosis not present

## 2018-09-08 DIAGNOSIS — R911 Solitary pulmonary nodule: Secondary | ICD-10-CM | POA: Diagnosis not present

## 2018-09-08 DIAGNOSIS — E039 Hypothyroidism, unspecified: Secondary | ICD-10-CM | POA: Diagnosis not present

## 2018-09-08 DIAGNOSIS — I888 Other nonspecific lymphadenitis: Secondary | ICD-10-CM | POA: Diagnosis not present

## 2018-09-08 HISTORY — DX: Pneumonia, unspecified organism: J18.9

## 2018-09-08 HISTORY — PX: VIDEO BRONCHOSCOPY WITH ENDOBRONCHIAL ULTRASOUND: SHX6177

## 2018-09-08 HISTORY — DX: Anemia, unspecified: D64.9

## 2018-09-08 HISTORY — DX: Gastro-esophageal reflux disease without esophagitis: K21.9

## 2018-09-08 HISTORY — DX: Headache, unspecified: R51.9

## 2018-09-08 HISTORY — DX: Localized enlarged lymph nodes: R59.0

## 2018-09-08 LAB — COMPREHENSIVE METABOLIC PANEL
ALT: 13 U/L (ref 0–44)
AST: 17 U/L (ref 15–41)
Albumin: 4 g/dL (ref 3.5–5.0)
Alkaline Phosphatase: 71 U/L (ref 38–126)
Anion gap: 13 (ref 5–15)
BUN: 14 mg/dL (ref 6–20)
CO2: 19 mmol/L — ABNORMAL LOW (ref 22–32)
Calcium: 9.6 mg/dL (ref 8.9–10.3)
Chloride: 108 mmol/L (ref 98–111)
Creatinine, Ser: 0.79 mg/dL (ref 0.44–1.00)
GFR calc Af Amer: >60 mL/min (ref >60)
GFR calc non Af Amer: >60 mL/min (ref >60)
Glucose, Bld: 96 mg/dL (ref 70–99)
Potassium: 4 mmol/L (ref 3.5–5.1)
Sodium: 140 mmol/L (ref 135–145)
Total Bilirubin: 0.8 mg/dL (ref 0.3–1.2)
Total Protein: 7.3 g/dL (ref 6.5–8.1)

## 2018-09-08 LAB — POCT PREGNANCY, URINE: Preg Test, Ur: NEGATIVE

## 2018-09-08 LAB — PROTIME-INR
INR: 1 (ref 0.8–1.2)
Prothrombin Time: 12.8 seconds (ref 11.4–15.2)

## 2018-09-08 LAB — CBC
HCT: 42.8 % (ref 36.0–46.0)
Hemoglobin: 13.4 g/dL (ref 12.0–15.0)
MCH: 28.6 pg (ref 26.0–34.0)
MCHC: 31.3 g/dL (ref 30.0–36.0)
MCV: 91.3 fL (ref 80.0–100.0)
Platelets: 213 10*3/uL (ref 150–400)
RBC: 4.69 MIL/uL (ref 3.87–5.11)
RDW: 12.8 % (ref 11.5–15.5)
WBC: 5.4 10*3/uL (ref 4.0–10.5)
nRBC: 0 % (ref 0.0–0.2)

## 2018-09-08 LAB — APTT: aPTT: 29 seconds (ref 24–36)

## 2018-09-08 SURGERY — BRONCHOSCOPY, WITH EBUS
Anesthesia: General

## 2018-09-08 MED ORDER — PROPOFOL 10 MG/ML IV BOLUS
INTRAVENOUS | Status: AC
  Filled 2018-09-08: qty 20

## 2018-09-08 MED ORDER — ACETAMINOPHEN 325 MG PO TABS
650.0000 mg | ORAL_TABLET | Freq: Once | ORAL | Status: AC
  Administered 2018-09-08: 650 mg via ORAL

## 2018-09-08 MED ORDER — FENTANYL CITRATE (PF) 100 MCG/2ML IJ SOLN: 25.0000 ug | INTRAMUSCULAR | Status: DC | PRN

## 2018-09-08 MED ORDER — ROCURONIUM BROMIDE 50 MG/5ML IV SOSY
PREFILLED_SYRINGE | INTRAVENOUS | Status: DC | PRN
  Administered 2018-09-08: 50 mg via INTRAVENOUS

## 2018-09-08 MED ORDER — PROPOFOL 1000 MG/100ML IV EMUL
INTRAVENOUS | Status: AC
  Filled 2018-09-08: qty 100

## 2018-09-08 MED ORDER — PROPOFOL 10 MG/ML IV BOLUS
INTRAVENOUS | Status: DC | PRN
  Administered 2018-09-08: 100 mg via INTRAVENOUS

## 2018-09-08 MED ORDER — PHENYLEPHRINE HCL (PRESSORS) 10 MG/ML IV SOLN
INTRAVENOUS | Status: DC | PRN
  Administered 2018-09-08 (×2): 80 ug via INTRAVENOUS
  Administered 2018-09-08: 120 ug via INTRAVENOUS
  Administered 2018-09-08: 40 ug via INTRAVENOUS
  Administered 2018-09-08: 120 ug via INTRAVENOUS

## 2018-09-08 MED ORDER — LIDOCAINE 2% (20 MG/ML) 5 ML SYRINGE
INTRAMUSCULAR | Status: DC | PRN
  Administered 2018-09-08: 60 mg via INTRAVENOUS

## 2018-09-08 MED ORDER — PHENYLEPHRINE 40 MCG/ML (10ML) SYRINGE FOR IV PUSH (FOR BLOOD PRESSURE SUPPORT)
PREFILLED_SYRINGE | INTRAVENOUS | Status: AC
  Filled 2018-09-08: qty 10

## 2018-09-08 MED ORDER — ONDANSETRON HCL 4 MG/2ML IJ SOLN
INTRAMUSCULAR | Status: AC
  Filled 2018-09-08: qty 2

## 2018-09-08 MED ORDER — ACETAMINOPHEN 325 MG PO TABS
ORAL_TABLET | ORAL | Status: AC
  Filled 2018-09-08: qty 2

## 2018-09-08 MED ORDER — 0.9 % SODIUM CHLORIDE (POUR BTL) OPTIME
TOPICAL | Status: DC | PRN
  Administered 2018-09-08: 1000 mL

## 2018-09-08 MED ORDER — EPHEDRINE 5 MG/ML INJ
INTRAVENOUS | Status: AC
  Filled 2018-09-08: qty 10

## 2018-09-08 MED ORDER — ONDANSETRON HCL 4 MG/2ML IJ SOLN
4.0000 mg | Freq: Four times a day (QID) | INTRAMUSCULAR | Status: AC | PRN
  Administered 2018-09-08: 4 mg via INTRAVENOUS

## 2018-09-08 MED ORDER — DEXAMETHASONE SODIUM PHOSPHATE 10 MG/ML IJ SOLN
INTRAMUSCULAR | Status: AC
  Filled 2018-09-08: qty 1

## 2018-09-08 MED ORDER — FENTANYL CITRATE (PF) 250 MCG/5ML IJ SOLN
INTRAMUSCULAR | Status: AC
  Filled 2018-09-08: qty 5

## 2018-09-08 MED ORDER — MIDAZOLAM HCL 2 MG/2ML IJ SOLN
INTRAMUSCULAR | Status: AC
  Filled 2018-09-08: qty 2

## 2018-09-08 MED ORDER — MIDAZOLAM HCL 2 MG/2ML IJ SOLN
INTRAMUSCULAR | Status: DC | PRN
  Administered 2018-09-08: 2 mg via INTRAVENOUS

## 2018-09-08 MED ORDER — SUGAMMADEX SODIUM 200 MG/2ML IV SOLN
INTRAVENOUS | Status: DC | PRN
  Administered 2018-09-08: 151.6 mg via INTRAVENOUS

## 2018-09-08 MED ORDER — LACTATED RINGERS IV SOLN
INTRAVENOUS | Status: DC
  Administered 2018-09-08 (×2): via INTRAVENOUS

## 2018-09-08 MED ORDER — FENTANYL CITRATE (PF) 250 MCG/5ML IJ SOLN
INTRAMUSCULAR | Status: DC | PRN
  Administered 2018-09-08 (×2): 50 ug via INTRAVENOUS

## 2018-09-08 MED ORDER — OXYCODONE HCL 5 MG PO TABS: 5.0000 mg | ORAL_TABLET | Freq: Once | ORAL | Status: DC | PRN

## 2018-09-08 MED ORDER — ONDANSETRON HCL 4 MG/2ML IJ SOLN
INTRAMUSCULAR | Status: DC | PRN
  Administered 2018-09-08: 4 mg via INTRAVENOUS

## 2018-09-08 MED ORDER — OXYCODONE HCL 5 MG/5ML PO SOLN: 5.0000 mg | Freq: Once | ORAL | Status: DC | PRN

## 2018-09-08 MED ORDER — DEXAMETHASONE SODIUM PHOSPHATE 10 MG/ML IJ SOLN
INTRAMUSCULAR | Status: DC | PRN
  Administered 2018-09-08: 5 mg via INTRAVENOUS

## 2018-09-08 MED ORDER — EPHEDRINE SULFATE 50 MG/ML IJ SOLN
INTRAMUSCULAR | Status: DC | PRN
  Administered 2018-09-08: 10 mg via INTRAVENOUS

## 2018-09-08 SURGICAL SUPPLY — 36 items
ADAPTER VALVE BIOPSY EBUS (MISCELLANEOUS) IMPLANT
ADPTR VALVE BIOPSY EBUS (MISCELLANEOUS)
BRUSH CYTOL CELLEBRITY 1.5X140 (MISCELLANEOUS) IMPLANT
CANISTER SUCT 3000ML PPV (MISCELLANEOUS) ×2 IMPLANT
CONT SPEC 4OZ CLIKSEAL STRL BL (MISCELLANEOUS) ×3 IMPLANT
COVER BACK TABLE 60X90IN (DRAPES) ×2 IMPLANT
FORCEPS BIOP RJ4 1.8 (CUTTING FORCEPS) ×1 IMPLANT
GAUZE SPONGE 4X4 12PLY STRL (GAUZE/BANDAGES/DRESSINGS) ×2 IMPLANT
GLOVE BIO SURGEON STRL SZ7.5 (GLOVE) ×2 IMPLANT
GLOVE BIOGEL PI IND STRL 6.5 (GLOVE) IMPLANT
GLOVE BIOGEL PI INDICATOR 6.5 (GLOVE) ×2
GOWN STRL NON-REIN LRG LVL3 (GOWN DISPOSABLE) ×1 IMPLANT
GOWN STRL REUS W/ TWL LRG LVL3 (GOWN DISPOSABLE) ×1 IMPLANT
GOWN STRL REUS W/TWL LRG LVL3 (GOWN DISPOSABLE) ×4
KIT CLEAN ENDO COMPLIANCE (KITS) ×4 IMPLANT
KIT TURNOVER KIT B (KITS) ×2 IMPLANT
MARKER SKIN DUAL TIP RULER LAB (MISCELLANEOUS) ×2 IMPLANT
NDL ASPIRATION VIZISHOT 19G (NEEDLE) IMPLANT
NDL ASPIRATION VIZISHOT 21G (NEEDLE) IMPLANT
NEEDLE ASPIRATION VIZISHOT 19G (NEEDLE) ×2 IMPLANT
NEEDLE ASPIRATION VIZISHOT 21G (NEEDLE) IMPLANT
NS IRRIG 1000ML POUR BTL (IV SOLUTION) ×2 IMPLANT
OIL SILICONE PENTAX (PARTS (SERVICE/REPAIRS)) ×2 IMPLANT
PAD ARMBOARD 7.5X6 YLW CONV (MISCELLANEOUS) ×4 IMPLANT
SYR 20CC LL (SYRINGE) ×4 IMPLANT
SYR 20ML ECCENTRIC (SYRINGE) ×4 IMPLANT
SYR 50ML SLIP (SYRINGE) ×1 IMPLANT
SYR 5ML LUER SLIP (SYRINGE) ×1 IMPLANT
TOWEL GREEN STERILE FF (TOWEL DISPOSABLE) ×2 IMPLANT
TRAP SPECIMEN MUCOUS 40CC (MISCELLANEOUS) ×2 IMPLANT
TUBE CONNECTING 20X1/4 (TUBING) ×4 IMPLANT
UNDERPAD 30X30 (UNDERPADS AND DIAPERS) ×2 IMPLANT
VALVE BIOPSY  SINGLE USE (MISCELLANEOUS) ×1
VALVE BIOPSY SINGLE USE (MISCELLANEOUS) ×1 IMPLANT
VALVE SUCTION BRONCHIO DISP (MISCELLANEOUS) ×3 IMPLANT
WATER STERILE IRR 1000ML POUR (IV SOLUTION) ×2 IMPLANT

## 2018-09-08 NOTE — Transfer of Care (Signed)
Immediate Anesthesia Transfer of Care Note  Patient: Barbara Cardenas  Procedure(s) Performed: VIDEO BRONCHOSCOPY WITH ENDOBRONCHIAL ULTRASOUND AND BIOPSIES (N/A )  Patient Location: PACU  Anesthesia Type:General  Level of Consciousness: drowsy and patient cooperative  Airway & Oxygen Therapy: Patient Spontanous Breathing and Patient connected to face mask oxygen  Post-op Assessment: Report given to RN and Post -op Vital signs reviewed and stable  Post vital signs: Reviewed and stable  Last Vitals:  Vitals Value Taken Time  BP 150/90 09/08/18 1327  Temp    Pulse 101 09/08/18 1329  Resp 23 09/08/18 1329  SpO2 97 % 09/08/18 1329  Vitals shown include unvalidated device data.  Last Pain:  Vitals:   09/08/18 0921  TempSrc: Oral  PainSc:       Patients Stated Pain Goal: 4 (85/46/27 0350)  Complications: No apparent anesthesia complications

## 2018-09-08 NOTE — Interval H&P Note (Signed)
PCCM Interval Note  47, never smoker history papillary thyroid cancer treated surgically.  Also suspected mild intermittent asthma.  She was found to have some calcified mediastinal lymphadenopathy on CT chest after being treated for influenza and a left lower lobe 3 acquired pneumonia in February.  The CT also shows a small focus of left basilar subsegmental atelectasis, cluster of small rounded calcifications in the mid posterior right upper lobe.  Etiology unclear, suspect calcified granulomatous disease, possibly histoplasmosis.  Suspicion for metastatic adenocarcinoma or lymphoma is low.  Consider remote atypical mycobacterial disease.   Recommendation made to achieve a tissue diagnosis via bronchoscopy with endobronchial ultrasound and nodal biopsies.  She presents for this today.  No new issues reported.  She has stable cough, allergies and GERD.  No dyspnea.  Vitals:   09/08/18 0915 09/08/18 0921  BP:  (!) 144/83  Pulse:  94  Resp:  18  Temp:  99.1 F (37.3 C)  TempSrc:  Oral  SpO2:  100%  Weight: 75.8 kg   Height: _0  (1.727 m)   Well-appearing, no distress.  Lungs clear bilaterally without any wheezes or crackles.  Heart regular without a murmur.  Abdomen benign.  No edema.  CBC CBC Latest Ref Rng & Units 09/08/2018 05/06/2018 04/20/2018  WBC 4.0 - 10.5 K/uL 5.4 6.3 10.0  Hemoglobin 12.0 - 15.0 g/dL 13.4 11.8(L) 13.6  Hematocrit 36.0 - 46.0 % 42.8 35.2(L) 40.3  Platelets 150 - 400 K/uL 213 274.0 241.0   BMP Latest Ref Rng & Units 09/08/2018 05/06/2018 10/06/2017  Glucose 70 - 99 mg/dL 96 92 79  BUN 6 - 20 mg/dL _1 Creatinine 0.44 - 1.00 mg/dL 0.79 0.71 0.71  Sodium 135 - 145 mmol/L 140 139 141  Potassium 3.5 - 5.1 mmol/L 4.0 3.8 4.2  Chloride 98 - 111 mmol/L 108 104 106  CO2 22 - 32 mmol/L 19(L) 27 26  Calcium 8.9 - 10.3 mg/dL 9.6 9.4 10.0   INR 1.0  Plans: Plan for endobronchial ultrasound with nodal biopsies, right upper lobe BAL to evaluate calcified nodular  findings in that region.  Procedure explained to patient, risk, benefits.  She agrees to proceed.  No barriers identified.  All questions answered.  Baltazar Apo, MD, PhD 09/08/2018, 11:15 AM [facility] [phone] or if no answer [phone]

## 2018-09-08 NOTE — Anesthesia Procedure Notes (Signed)
Procedure Name: Intubation Date/Time: 09/08/2018 11:58 AM Performed by: Kathryne Hitch, CRNA Patient Re-evaluated:Patient Re-evaluated prior to induction Oxygen Delivery Method: Circle system utilized Preoxygenation: Pre-oxygenation with 100% oxygen Induction Type: IV induction Ventilation: Mask ventilation without difficulty Laryngoscope Size: Mac and 3 Grade View: Grade I Tube type: Oral Tube size: 8.5 mm Number of attempts: 1 Placement Confirmation: ETT inserted through vocal cords under direct vision,  positive ETCO2 and breath sounds checked- equal and bilateral Secured at: 23 cm Tube secured with: Tape Dental Injury: Teeth and Oropharynx as per pre-operative assessment

## 2018-09-08 NOTE — Anesthesia Preprocedure Evaluation (Signed)
Anesthesia Evaluation  Patient identified by MRN, date of birth, ID band Patient awake    Reviewed: Allergy & Precautions, H&P , NPO status , Patient's Chart, lab work & pertinent test results  Airway Mallampati: II   Neck ROM: full    Dental   Pulmonary asthma ,  RUL mass   breath sounds clear to auscultation       Cardiovascular negative cardio ROS   Rhythm:regular Rate:Normal     Neuro/Psych  Headaches, PSYCHIATRIC DISORDERS Anxiety    GI/Hepatic GERD  ,  Endo/Other  Hypothyroidism   Renal/GU      Musculoskeletal   Abdominal   Peds  Hematology   Anesthesia Other Findings   Reproductive/Obstetrics                             Anesthesia Physical Anesthesia Plan  ASA: II  Anesthesia Plan: General   Post-op Pain Management:    Induction: Intravenous  PONV Risk Score and Plan: 3 and Ondansetron, Dexamethasone, Midazolam and Treatment may vary due to age or medical condition  Airway Management Planned: Oral ETT  Additional Equipment:   Intra-op Plan:   Post-operative Plan: Extubation in OR  Informed Consent: I have reviewed the patients History and Physical, chart, labs and discussed the procedure including the risks, benefits and alternatives for the proposed anesthesia with the patient or authorized representative who has indicated his/her understanding and acceptance.       Plan Discussed with: CRNA, Anesthesiologist and Surgeon  Anesthesia Plan Comments:         Anesthesia Quick Evaluation

## 2018-09-08 NOTE — Op Note (Signed)
Video Bronchoscopy with Endobronchial Ultrasound Procedure Note  Date of Operation: 09/08/2018  Pre-op Diagnosis: Mediastinal lymphadenopathy  Post-op Diagnosis: Same  Surgeon: Baltazar Apo  Assistants: None  Anesthesia: General endotracheal anesthesia  Operation: Flexible video fiberoptic bronchoscopy with endobronchial ultrasound and biopsies.  Estimated Blood Loss: Minimal  Complications: None apparent  Indications and History: Barbara Cardenas is a 48 y.o. female with history of remote papillary thyroid cancer.  Noted to have calcified mediastinal lymphadenopathy as well as some punctate calcified nodules in the right upper lobe.  Recommendation was made to achieve tissue diagnosis via bronchoscopy with endobronchial ultrasound and biopsies.  The risks, benefits, complications, treatment options and expected outcomes were discussed with the patient.  The possibilities of pneumothorax, pneumonia, reaction to medication, pulmonary aspiration, perforation of a viscus, bleeding, failure to diagnose a condition and creating a complication requiring transfusion or operation were discussed with the patient who freely signed the consent.    Description of Procedure: The patient was examined in the preoperative area and history and data from the preprocedure consultation were reviewed. It was deemed appropriate to proceed.  The patient was taken to OR10, identified as Barbara Cardenas and the procedure verified as Flexible Video Fiberoptic Bronchoscopy.  A Time Out was held and the above information confirmed. After being taken to the operating room general anesthesia was initiated and the patient  was orally intubated. The video fiberoptic bronchoscope was introduced via the endotracheal tube and a general inspection was performed which showed hypervascular bronchial and tracheal mucosa (distal trachea photographed).  There was also a small hypopigmented raised nodule at the distal left  mainstem bronchus.  This was biopsied with forceps for pathology.  Right upper lobe BAL was performed in both the anterior and posterior segments of the right upper lobe to be sent for microbiology.  The standard scope was then withdrawn and the endobronchial ultrasound was used to identify and characterize the peritracheal, hilar and bronchial lymph nodes. Inspection showed multiple segmented irregularly-shaped nodes stations 4R, 7, 10 R.  4L cannot be evaluated.  The 7 node had some vascularity present.  There was calcium present in all of the nodes identified. Using real-time ultrasound guidance Wang needle biopsies were take from [place], 7, 10 R nodes and were sent for cytology.  Material was saved from 4R in saline to facilitate flow cytometry.  The patient tolerated the procedure well without apparent complications. There was no significant blood loss. The bronchoscope was withdrawn. Anesthesia was reversed and the patient was taken to the PACU for recovery.   Samples: 1. Wang needle biopsies from 4R node 2. Wang needle biopsies from 7 node 3. Wang needle biopsies from 10R node 4. Endobronchial biopsies from distal left mainstem bronchus endobronchial nodule 5.  Right upper lobe BAL for microbiology (x2)  Plans:  The patient will be discharged from the PACU to home when recovered from anesthesia. We will review the cytology, pathology and microbiology results with the patient when they become available. Outpatient followup will be with Dr Lamonte Sakai.    Baltazar Apo, MD, PhD 09/08/2018, 1:26 PM [facility] [phone] or if no answer [phone]

## 2018-09-08 NOTE — Discharge Instructions (Addendum)
Flexible Bronchoscopy, Care After This sheet gives you information about how to care for yourself after your test. Your doctor may also give you more specific instructions. If you have problems or questions, contact your doctor. Follow these instructions at home: Eating and drinking  Do not eat or drink anything (not even water) for 2 hours after your test, or until your numbing medicine (local anesthetic) wears off.  When your numbness is gone and your cough and gag reflexes have come back, you may: ? Eat only soft foods. ? Slowly drink liquids.  The day after the test, go back to your normal diet. Driving  Do not drive for 24 hours if you were given a medicine to help you relax (sedative).  Do not drive or use heavy machinery while taking prescription pain medicine. General instructions   Take over-the-counter and prescription medicines only as told by your doctor.  Return to your normal activities as told. Ask what activities are safe for you.  Do not use any products that have nicotine or tobacco in them. This includes cigarettes and e-cigarettes. If you need help quitting, ask your doctor.  Keep all follow-up visits as told by your doctor. This is important. It is very important if you had a tissue sample (biopsy) taken. Get help right away if:  You have shortness of breath that gets worse.  You get light-headed.  You feel like you are going to pass out (faint).  You have chest pain.  You cough up: ? More than a little blood. ? More blood than before. Summary  Do not eat or drink anything (not even water) for 2 hours after your test, or until your numbing medicine wears off.  Do not use cigarettes. Do not use e-cigarettes.  Get help right away if you have chest pain.   Please call our office for any questions or concerns.  [phone].   This information is not intended to replace advice given to you by your health care provider. Make sure you discuss any  questions you have with your health care provider. Document Released: 12/08/2008 Document Revised: 01/23/2017 Document Reviewed: 02/29/2016 Elsevier Patient Education  2020 [employer].

## 2018-09-09 ENCOUNTER — Encounter (HOSPITAL_COMMUNITY): Payer: Self-pay | Admitting: Emergency Medicine

## 2018-09-09 LAB — ACID FAST SMEAR (AFB, MYCOBACTERIA): Acid Fast Smear: NEGATIVE

## 2018-09-09 NOTE — Anesthesia Postprocedure Evaluation (Signed)
Anesthesia Post Note  Patient: Barbara Cardenas  Procedure(s) Performed: VIDEO BRONCHOSCOPY WITH ENDOBRONCHIAL ULTRASOUND AND BIOPSIES (N/A )     Patient location during evaluation: PACU Anesthesia Type: General Level of consciousness: awake and alert Pain management: pain level controlled Vital Signs Assessment: post-procedure vital signs reviewed and stable Respiratory status: spontaneous breathing, nonlabored ventilation, respiratory function stable and patient connected to nasal cannula oxygen Cardiovascular status: blood pressure returned to baseline and stable Postop Assessment: no apparent nausea or vomiting Anesthetic complications: no    Last Vitals:  Vitals:   09/08/18 1425 09/08/18 1430  BP: 124/86 128/86  Pulse: 99 98  Resp: 14 (!) 22  Temp:    SpO2: 93% 93%    Last Pain:  Vitals:   09/08/18 1420  TempSrc:   PainSc: 0-No pain                 Bethene Hankinson S

## 2018-09-10 ENCOUNTER — Telehealth: Payer: Self-pay | Admitting: Emergency Medicine

## 2018-09-10 LAB — CULTURE, RESPIRATORY W GRAM STAIN
Culture: NO GROWTH
Culture: NO GROWTH
Gram Stain: NONE SEEN
Gram Stain: NONE SEEN

## 2018-09-10 NOTE — Telephone Encounter (Signed)
Reviewed cytology, pathology, flow cytometry results with the patient today.  No evidence for a monoclonal population or lymphoma.  I have results back from the 10 R and 7 lymph nodes as well as endobronchial biopsies from the left mainstem.  There were some nonnecrotizing granulomas noted there.  The 4R node is still pending as are all the cultures.  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  We will follow-up the final path and micro with her when available.

## 2018-09-12 LAB — ACID FAST SMEAR (AFB, MYCOBACTERIA): Acid Fast Smear: NEGATIVE

## 2018-09-13 DIAGNOSIS — H40053 Ocular hypertension, bilateral: Secondary | ICD-10-CM | POA: Diagnosis not present

## 2018-09-15 DIAGNOSIS — J301 Allergic rhinitis due to pollen: Secondary | ICD-10-CM | POA: Diagnosis not present

## 2018-09-15 DIAGNOSIS — J3089 Other allergic rhinitis: Secondary | ICD-10-CM | POA: Diagnosis not present

## 2018-09-15 DIAGNOSIS — J3081 Allergic rhinitis due to animal (cat) (dog) hair and dander: Secondary | ICD-10-CM | POA: Diagnosis not present

## 2018-09-17 DIAGNOSIS — J3089 Other allergic rhinitis: Secondary | ICD-10-CM | POA: Diagnosis not present

## 2018-09-17 DIAGNOSIS — J301 Allergic rhinitis due to pollen: Secondary | ICD-10-CM | POA: Diagnosis not present

## 2018-09-17 DIAGNOSIS — J3081 Allergic rhinitis due to animal (cat) (dog) hair and dander: Secondary | ICD-10-CM | POA: Diagnosis not present

## 2018-09-28 DIAGNOSIS — J3081 Allergic rhinitis due to animal (cat) (dog) hair and dander: Secondary | ICD-10-CM | POA: Diagnosis not present

## 2018-09-28 DIAGNOSIS — J301 Allergic rhinitis due to pollen: Secondary | ICD-10-CM | POA: Diagnosis not present

## 2018-09-28 DIAGNOSIS — J3089 Other allergic rhinitis: Secondary | ICD-10-CM | POA: Diagnosis not present

## 2018-10-05 ENCOUNTER — Encounter: Payer: Self-pay | Admitting: Emergency Medicine

## 2018-10-05 ENCOUNTER — Other Ambulatory Visit: Payer: Self-pay

## 2018-10-05 ENCOUNTER — Ambulatory Visit: Payer: BC Managed Care – PPO | Admitting: Emergency Medicine

## 2018-10-05 VITALS — BP 128/88 | HR 100 | Ht 68.0 in | Wt 165.0 lb

## 2018-10-05 DIAGNOSIS — J3081 Allergic rhinitis due to animal (cat) (dog) hair and dander: Secondary | ICD-10-CM | POA: Diagnosis not present

## 2018-10-05 DIAGNOSIS — J4520 Mild intermittent asthma, uncomplicated: Secondary | ICD-10-CM

## 2018-10-05 DIAGNOSIS — J3089 Other allergic rhinitis: Secondary | ICD-10-CM | POA: Diagnosis not present

## 2018-10-05 DIAGNOSIS — J301 Allergic rhinitis due to pollen: Secondary | ICD-10-CM | POA: Diagnosis not present

## 2018-10-05 DIAGNOSIS — R590 Localized enlarged lymph nodes: Secondary | ICD-10-CM

## 2018-10-05 NOTE — Assessment & Plan Note (Signed)
Currently managed with Singulair and albuterol as needed.  I have recommended repeat pulmonary function testing just to quantify her current degree of obstruction.  She can get this at her convenience and we will compare with her priors.

## 2018-10-05 NOTE — Assessment & Plan Note (Addendum)
Endobronchial ultrasound and culture data are all reassuring so far.  There was some noncaseating granulomatous change noted on nodal biopsies, no evidence for monoclonal population or lymphoma.  I suspect that this is chronic granulomatous disease in the setting of prior fungal exposure.  I did talk to her today about the possibility for systemic granulomatous disease such as sarcoidosis.  I do not have any evidence to support right now but we will maintain surveillance.  She does have a history of asthma which can go along with sarcoid.  I think she needs surveillance chest x-rays and clinical visits.  She knows to contact me if she has any changes in her breathing, rash, other symptoms that would increase our suspicion for sarcoidosis.

## 2018-10-05 NOTE — Progress Notes (Signed)
   Subjective:    Patient ID: Barbara Cardenas, female    DOB: 1971/02/11, 48 y.o.   MRN: [id number]  HPI 48 year old never smoker with history of papillary thyroid cancer treated surgically, fairly severe seasonal allergies, hyperlipidemia.  She carries a diagnosis of mild intermittent asthma that was made 2015, has seen Dr Orvil Feil. She is on immunotherapy.   She has been quite symptomatic this year, was diagnosed with influenza and a left lower lobe community-acquired pneumonia in February.  She had persistent cough even after antibiotic treatment.  Her allergies were active through the spring, treated for possible sinusitis then and again more recently 1 month ago.  A CT chest was done on 08/16/2018 given the recurrent / persistent symptoms and the persistent infiltrate on chest x-ray from February.  I reviewed, this shows a small focus of left basilar subsegmental atelectasis or scar, some mildly enlarged mediastinal lymphadenopathy with calcifications, cluster of small rounded calcifications in the right upper lobe.    ROV 10/05/2018 --pleasant 48 year old woman who follows up today.  She underwent bronchoscopy with endobronchial ultrasound on 09/10/2018 to evaluate mediastinal lymphadenopathy and some calcified micronodular disease especially in the right upper lobe.  There was no evidence of malignancy, no monoclonal population of lymphocytes.  There was some evidence of nonnecrotizing granulomatous disease.  Acid-fast and fungal smears are negative.  The final AFB cultures are still pending, now almost 80 month old.  She is on immunotherapy for allergic rhinitis, has chronic cough and some symptoms consistent with possible mild intermittent asthma. Uses albuterol very rarely, is on singulair and allegra.         Objective:   Physical Exam Vitals:   10/05/18 0900  BP: 128/88  Pulse: 100  SpO2: 100%  Weight: 165 lb (74.8 kg)  Height: 5\' 8"  (1.727 m)   Gen: Pleasant, well-nourished, in no  distress,  normal affect  ENT: No lesions,  mouth clear,  oropharynx clear, no postnasal drip  Neck: No JVD, no stridor  Lungs: No use of accessory muscles, no crackles or wheezing on normal respiration, no wheeze on forced expiration  Cardiovascular: RRR, heart sounds normal, no murmur or gallops, no peripheral edema  Musculoskeletal: No deformities, no cyanosis or clubbing  Neuro: alert, awake, non focal  Skin: Warm, no lesions or rash     Assessment & Plan:  Mediastinal lymphadenopathy Endobronchial ultrasound and culture data are all reassuring so far.  There was some noncaseating granulomatous change noted on nodal biopsies, no evidence for monoclonal population or lymphoma.  I suspect that this is chronic granulomatous disease in the setting of prior fungal exposure.  I did talk to her today about the possibility for systemic granulomatous disease such as sarcoidosis.  I do not have any evidence to support right now but we will maintain surveillance.  She does have a history of asthma which can go along with sarcoid.  I think she needs surveillance chest x-rays and clinical visits.  She knows to contact me if she has any changes in her breathing, rash, other symptoms that would increase our suspicion for sarcoidosis.  Mild intermittent asthma Currently managed with Singulair and albuterol as needed.  I have recommended repeat pulmonary function testing just to quantify her current degree of obstruction.  She can get this at her convenience and we will compare with her priors.  Baltazar Apo, MD, PhD 10/05/2018, 9:48 AM [facility] [phone] or if no answer [phone]

## 2018-10-05 NOTE — Patient Instructions (Addendum)
Please continue your Singulair, Allegra, allergy shots as you have been doing them. Keep your albuterol available use 2 puffs if needed for shortness of breath, chest tightness, wheezing. We will perform pulmonary function testing at your convenience to compare with your priors. Your lymph node biopsies show some granulomatous inflammation without any evidence of infection.  There is no evidence to support lymphoma.  This is all good news.  Please contact us if you develop new symptoms especially changes in your breathing, rash. Follow-up in 1 year with a chest x-ray, sooner if you have any breathing difficulty.

## 2018-10-11 DIAGNOSIS — J3081 Allergic rhinitis due to animal (cat) (dog) hair and dander: Secondary | ICD-10-CM | POA: Diagnosis not present

## 2018-10-11 DIAGNOSIS — J301 Allergic rhinitis due to pollen: Secondary | ICD-10-CM | POA: Diagnosis not present

## 2018-10-11 DIAGNOSIS — J3089 Other allergic rhinitis: Secondary | ICD-10-CM | POA: Diagnosis not present

## 2018-10-12 LAB — FUNGAL ORGANISM REFLEX

## 2018-10-12 LAB — FUNGUS CULTURE RESULT

## 2018-10-12 LAB — FUNGUS CULTURE WITH STAIN

## 2018-10-13 DIAGNOSIS — J301 Allergic rhinitis due to pollen: Secondary | ICD-10-CM | POA: Diagnosis not present

## 2018-10-13 DIAGNOSIS — J3089 Other allergic rhinitis: Secondary | ICD-10-CM | POA: Diagnosis not present

## 2018-10-13 DIAGNOSIS — J3081 Allergic rhinitis due to animal (cat) (dog) hair and dander: Secondary | ICD-10-CM | POA: Diagnosis not present

## 2018-10-14 LAB — FUNGUS CULTURE WITH STAIN

## 2018-10-14 LAB — FUNGAL ORGANISM REFLEX

## 2018-10-14 LAB — FUNGUS CULTURE RESULT

## 2018-10-20 DIAGNOSIS — J3089 Other allergic rhinitis: Secondary | ICD-10-CM | POA: Diagnosis not present

## 2018-10-20 DIAGNOSIS — J3081 Allergic rhinitis due to animal (cat) (dog) hair and dander: Secondary | ICD-10-CM | POA: Diagnosis not present

## 2018-10-20 DIAGNOSIS — J301 Allergic rhinitis due to pollen: Secondary | ICD-10-CM | POA: Diagnosis not present

## 2018-10-22 DIAGNOSIS — J3081 Allergic rhinitis due to animal (cat) (dog) hair and dander: Secondary | ICD-10-CM | POA: Diagnosis not present

## 2018-10-22 DIAGNOSIS — J301 Allergic rhinitis due to pollen: Secondary | ICD-10-CM | POA: Diagnosis not present

## 2018-10-22 DIAGNOSIS — J3089 Other allergic rhinitis: Secondary | ICD-10-CM | POA: Diagnosis not present

## 2018-10-22 LAB — ACID FAST CULTURE WITH REFLEXED SENSITIVITIES (MYCOBACTERIA): Acid Fast Culture: NEGATIVE

## 2018-10-24 LAB — ACID FAST CULTURE WITH REFLEXED SENSITIVITIES (MYCOBACTERIA): Acid Fast Culture: NEGATIVE

## 2018-10-25 ENCOUNTER — Ambulatory Visit: Payer: Self-pay | Admitting: *Deleted

## 2018-10-25 DIAGNOSIS — R29810 Facial weakness: Secondary | ICD-10-CM | POA: Diagnosis not present

## 2018-10-25 DIAGNOSIS — R0981 Nasal congestion: Secondary | ICD-10-CM | POA: Diagnosis not present

## 2018-10-25 DIAGNOSIS — R51 Headache: Secondary | ICD-10-CM | POA: Diagnosis not present

## 2018-10-25 DIAGNOSIS — R200 Anesthesia of skin: Secondary | ICD-10-CM | POA: Diagnosis not present

## 2018-10-25 DIAGNOSIS — R531 Weakness: Secondary | ICD-10-CM | POA: Diagnosis not present

## 2018-10-25 NOTE — Telephone Encounter (Signed)
See note

## 2018-10-25 NOTE — Telephone Encounter (Signed)
Called patient she was able to get someone to come sit with son she is on the way to ED now.

## 2018-10-25 NOTE — Telephone Encounter (Signed)
Pt called stating that when she put he chapstick on last night, she had a hard time rubbing her lips together; she also says that the left side of her mouth does not open normally; her bottom left lip feels numb; she noticed it around 2230; she says that when she smiles the bottom left lip droops; it looks normal when her mouth is closed; the pt said that on 10/22/2018 she had a lot pressure and congestion in both ears; today she had pain in her left; the pt has taken a decongestant and advil; recommendations made per nurse triage protocol; she verbalized understanding but does not want to go to the ED because she has a 47 year old at home, and her husband is out of town; attempted to contact [facility] x 2 without success; the pt sees Dr Briscoe Deutscher, and can be contacted at [phone]; will route to office for final disposition. Reason for Disposition . Bell's palsy suspected (i.e., weakness on only one side of the face, developing over hours to days, no other symptoms)  Answer Assessment - Initial Assessment Questions 1. SYMPTOM: "What is the main symptom you are concerned about?" (e.g., weakness, numbness)     Left lower lip numbness and droop 2. ONSET: "When did this start?" (minutes, hours, days; while sleeping)  hours 3. LAST NORMAL: "When was the last time you were normal (no symptoms)?"    10/24/2018 4. PATTERN "Does this come and go, or has it been constant since it started?"  "Is it present now?"   constant 5. CARDIAC SYMPTOMS: "Have you had any of the following symptoms: chest pain, difficulty breathing, palpitations?"   no 6. NEUROLOGIC SYMPTOMS: "Have you had any of the following symptoms: headache, dizziness, vision loss, double vision, changes in speech, unsteady on your feet?"    difficulty focusing with reading glasses on; aleady seen MD (high pressure  eyes due to steroid nose drop for allergies) 7. OTHER SYMPTOMS: "Do you have any other symptoms?"     Left ear pain;  bilateral ear congestion 8. PREGNANCY: "Is there any chance you are pregnant?" "When was your last menstrual period?"     No, LMP 08/14/2018  Protocols used: NEUROLOGIC DEFICIT-A-AH

## 2018-10-26 ENCOUNTER — Telehealth: Payer: Self-pay | Admitting: Family Medicine

## 2018-10-26 NOTE — Telephone Encounter (Signed)
Okay referral and Valium.

## 2018-10-26 NOTE — Telephone Encounter (Signed)
Called patient have made f/u app with Dr. Jerline Pain. Patient was not given app for MRI or neuro for follow up.

## 2018-10-26 NOTE — Telephone Encounter (Signed)
Pt states the emergency room dr wants to have a MRI.  The emergency did not find anything yesterday. Pt went to the one in [place]. But she cannot do anything but an open MRI. If she has to do the other, she will need a sedative.  Pt states she is not any worse, just the same. Also she may need to see a neurologisr, and she would prefer Dr Jaynee Eagles if she can see her soon.

## 2018-10-26 NOTE — Telephone Encounter (Signed)
Patient called back requesting status update, please advise.

## 2018-10-26 NOTE — Telephone Encounter (Signed)
Please advise ok to send referral stat for neruo and send in valium 5 mg 1 po 30 min before and one at time of MRI if still anxious?

## 2018-10-27 ENCOUNTER — Encounter: Payer: Self-pay | Admitting: Family Medicine

## 2018-10-27 ENCOUNTER — Ambulatory Visit: Payer: BC Managed Care – PPO | Admitting: Family Medicine

## 2018-10-27 ENCOUNTER — Other Ambulatory Visit: Payer: Self-pay

## 2018-10-27 VITALS — BP 106/66 | HR 95 | Temp 98.1°F | Ht 68.0 in | Wt 164.4 lb

## 2018-10-27 DIAGNOSIS — F40240 Claustrophobia: Secondary | ICD-10-CM

## 2018-10-27 DIAGNOSIS — G510 Bell's palsy: Secondary | ICD-10-CM | POA: Diagnosis not present

## 2018-10-27 MED ORDER — DIAZEPAM 5 MG PO TABS: ORAL_TABLET | ORAL | 0 refills | Status: DC

## 2018-10-27 MED ORDER — PREDNISONE 20 MG PO TABS: 60.0000 mg | ORAL_TABLET | Freq: Every day | ORAL | 0 refills | Status: AC

## 2018-10-27 NOTE — Patient Instructions (Signed)
It was very nice to see you today!  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  This is also called Bell's palsy.  Please start the prednisone.  Please use eyedrops for eye irritation.  We will schedule you to have an MRI and neurology consult.  Please let me know or let Dr. Juleen China know if symptoms worsen.  Take care, Dr Criselda Peaches Palsy, Adult  Bell palsy is a short-term inability to move muscles in part of the face. The inability to move (paralysis) results from inflammation or compression of the facial nerve, which travels along the skull and under the ear to the side of the face (7th cranial nerve). This nerve is responsible for facial movements that include blinking, closing the eyes, smiling, and frowning. What are the causes? The exact cause of this condition is not known. It may be caused by an infection from a virus, such as the chickenpox (herpes zoster), Epstein-Barr, or mumps virus. What increases the risk? You are more likely to develop this condition if:  You are pregnant.  You have diabetes.  You have had a recent infection in your nose, throat, or airways (upper respiratory infection).  You have a weakened body defense system (immune system).  You have had a facial injury, such as a fracture.  You have a family history of Bell palsy. What are the signs or symptoms? Symptoms of this condition include:  Weakness on one side of the face.  Drooping eyelid and corner of the mouth.  Excessive tearing in one eye.  Difficulty closing the eyelid.  Dry eye.  Drooling.  Dry mouth.  Changes in taste.  Change in facial appearance.  Pain behind one ear.  Ringing in one or both ears.  Sensitivity to sound in one ear.  Facial twitching.  Headache.  Impaired speech.  Dizziness.  Difficulty eating or drinking. Most of the time, only one side of the face is affected. Rarely, Bell palsy affects the whole face. How is this diagnosed? This condition is diagnosed  based on:  Your symptoms.  Your medical history.  A physical exam. You may also have to see health care providers who specialize in disorders of the nerves (neurologist) or diseases and conditions of the eye (ophthalmologist). You may have tests, such as:  A test to check for nerve damage (electromyogram).  Imaging studies, such as CT or MRI scans.  Blood tests. How is this treated? This condition affects every person differently. Sometimes symptoms go away without treatment within a couple weeks. If treatment is needed, it varies from person to person. The goal of treatment is to reduce inflammation and protect the eye from damage. Treatment for Bell palsy may include:  Medicines, such as: ? Steroids to reduce swelling and inflammation. ? Antiviral drugs. ? Pain relievers, including aspirin, acetaminophen, or ibuprofen.  Eye drops or ointment to keep your eye moist.  Eye protection, if you cannot close your eye.  Exercises or massage to regain muscle strength and function (physical therapy). Follow these instructions at home:   Take over-the-counter and prescription medicines only as told by your health care provider.  If your eye is affected: ? Keep your eye moist with eye drops or ointment as told by your health care provider. ? Follow instructions for eye care and protection as told by your health care provider.  Do any physical therapy exercises as told by your health care provider.  Keep all follow-up visits as told by your health care provider. This  is important. Contact a health care provider if:  You have a fever.  Your symptoms do not get better within 2-3 weeks, or your symptoms get worse.  Your eye is red, irritated, or painful.  You have new symptoms. Get help right away if:  You have weakness or numbness in a part of your body other than your face.  You have trouble swallowing.  You develop neck pain or stiffness.  You develop dizziness or  shortness of breath. Summary  Bell palsy is a short-term inability to move muscles in part of the face. The inability to move (paralysis) results from inflammation or compression of the facial nerve.  This condition affects every person differently. Sometimes symptoms go away without treatment within a couple weeks.  If treatment is needed, it varies from person to person. The goal of treatment is to reduce inflammation and protect the eye from damage.  Contact your health care provider if your symptoms do not get better within 2-3 weeks, or your symptoms get worse. This information is not intended to replace advice given to you by your health care provider. Make sure you discuss any questions you have with your health care provider. Document Released: 02/10/2005 Document Revised: 01/23/2017 Document Reviewed: 04/15/2016 Elsevier Patient Education  2020 [employer].

## 2018-10-27 NOTE — Progress Notes (Signed)
   Chief Complaint:  Barbara Cardenas is a 48 y.o. female who presents today with a chief complaint of ED follow up for facial weakness.   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  She does have a history of questionable sarcoidosis and history of thyroid cancer.  Will obtain brain MRI to rule out any other possible causes.  Will start prednisone 60 mg daily for the next week.  Will send in Valium for sedation for her MRI due to claustrophobia. Will also place referral to neurology per patient request.     Subjective:  HPI:  Facial Weakness Patient presented to the ED on 10/25/2018 with sudden onset left sided facial numbness and weakness. She had a head CT done which showed no acute abnormalities. Patient was recommended to have MRI however she was unable to do this due to severe claustrophobia.  Over the last couple days symptoms have slightly worsened.  She has had increased weakness to the left side of her face.  She has also had excessive tearing to her left eye.  No other weakness or numbness.  No difficulty speaking or swallowing.  No vision changes.  No specific treatments tried.  No other obvious precipitating events.  No history of cold sores in the past.  No other obvious alleviating or aggravating factors.  ROS: Per HPI  PMH: She reports that she has never smoked. She has never used smokeless tobacco. She reports current alcohol use. She reports that she does not use drugs.      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  Otherwise cranial nerves II through XII  intact.  Strength 5 out of 5 in upper and lower extremities.  Sensation light touch intact throughout. Weakness noted with raising eyebrow on the left.  Psych: Normal affect and thought content      Caleb M. Jerline Pain, MD 10/27/2018 8:39 AM

## 2018-11-02 ENCOUNTER — Other Ambulatory Visit: Payer: Self-pay | Admitting: Family Medicine

## 2018-11-02 DIAGNOSIS — G510 Bell's palsy: Secondary | ICD-10-CM

## 2018-11-12 ENCOUNTER — Telehealth: Payer: Self-pay

## 2018-11-12 ENCOUNTER — Other Ambulatory Visit: Payer: Self-pay

## 2018-11-12 ENCOUNTER — Ambulatory Visit (INDEPENDENT_AMBULATORY_CARE_PROVIDER_SITE_OTHER): Payer: BC Managed Care – PPO | Admitting: Neurology

## 2018-11-12 ENCOUNTER — Encounter: Payer: Self-pay | Admitting: Neurology

## 2018-11-12 VITALS — BP 110/80 | HR 97 | Ht 68.0 in | Wt 167.0 lb

## 2018-11-12 DIAGNOSIS — R202 Paresthesia of skin: Secondary | ICD-10-CM

## 2018-11-12 DIAGNOSIS — R209 Unspecified disturbances of skin sensation: Secondary | ICD-10-CM | POA: Diagnosis not present

## 2018-11-12 DIAGNOSIS — G510 Bell's palsy: Secondary | ICD-10-CM

## 2018-11-12 NOTE — Telephone Encounter (Signed)
Pt is scheduled at [facility] for 11/18/2018 at 10 fyi

## 2018-11-12 NOTE — Telephone Encounter (Signed)
I sent referral to [facility] and insurance, order. Pt to be notified of the appt, faxed to [phone].

## 2018-11-12 NOTE — Patient Instructions (Signed)
MRI brain wwo contrast will be ordered at [facility]), as you have requested open MRI.  Please take someone to drive you to your appointment.  We will call you with the results when they are available to [place].

## 2018-11-12 NOTE — Progress Notes (Signed)
[facility] Clinic Note - Initial Visit   Date: 11/12/18  Barbara Cardenas MRN: [id number] DOB: Sep 08, 1970   Dear Dr. Jerline Pain:  Thank you for your kind referral of Barbara Cardenas for consultation of left facial weakness. Although her history is well known to you, please allow Barbara Cardenas to reiterate it for the purpose of our medical record. The patient was accompanied to the clinic by self.    History of Present Illness: Barbara Cardenas is a 48 y.o. right-handed female with thyroid cancer s/p thyroidectomy and seasonal allergies presenting for evaluation of left facial weakness.  On the night of 8/30, she was applying lip gloss and noticed that her lip was weak, stating "it didn't work right" associated with numbness over the left lower lip.  The following morning on 8/31, she noticed that her blink on the left eye was asymmetric, left facial weakness, difficulty with chewing, and numbness over the left cheek.  She went to the ER on 10/25/2018 where CT/A head was normal.  MRI brain was recommended, but due to claustrophobia, she elected not to have it.  She was evaluated by her PCP on 9/2 who has ordered MRI brain and started on prednisone 60mg  x 7 days.  She has noticed that her smile has improved and tongue tingling has significantly improved.  She has no problems with swallowing or chewing.  She still has weakness over the left cheek and unable to puff the cheek or use a straw on that side.   She is seeing an allergist for severe allergies and is on immunotherapy titration dose.  Two days prior to symptom onset, she was supposed to be transitioned to have maintenance therapy.  Earlier this summer, there was also question whether she had pulmonary sarcoidosis and she is being followed clinically by pulmonology.  Out-side paper records, electronic medical record, and images have been reviewed where available and summarized as:  Lab Results  Component Value Date    HGBA1C 5.3 08/14/2017   Lab Results  Component Value Date   [id number] 05/31/2015   Lab Results  Component Value Date   TSH 1.88 04/02/2015   Lab Results  Component Value Date   ESRSEDRATE 2 05/31/2015    Past Medical History:  Diagnosis Date  . Anemia    during pregnancy  . Cancer of thyroid ([place]) 09/07/2015  . 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 GERD (gastroesophageal reflux disease)   . Headache   . Hypercholesteremia 12/21/2013  . Mediastinal lymphadenopathy   . Neuropathy   . Pneumonia   . Postoperative hypothyroidism 09/07/2015  . Seasonal allergies 07/20/2016  . Thyroid cancer ([place])   . Vision abnormalities     Past Surgical History:  Procedure Laterality Date  . DILATION AND CURETTAGE OF UTERUS    . THYROIDECTOMY, PARTIAL     full  . VIDEO BRONCHOSCOPY WITH ENDOBRONCHIAL ULTRASOUND N/A 09/08/2018   Procedure: VIDEO BRONCHOSCOPY WITH ENDOBRONCHIAL ULTRASOUND AND BIOPSIES;  Surgeon: Collene Gobble, MD;  Location: [facility] OR;  Service: Thoracic;  Laterality: N/A;     Medications:  Outpatient Encounter Medications as of 11/12/2018  Medication Sig Note  . acetaminophen (TYLENOL) 500 MG tablet Take 1,000 mg by mouth daily as needed for moderate pain or headache.   . albuterol (PROAIR HFA) 108 (90 Base) MCG/ACT inhaler Inhale 2 puffs into the lungs every 6 (six) hours as needed for wheezing or shortness of breath.    . ALPRAZolam (XANAX) 0.5 MG tablet Take  1 tablet (0.5 mg total) by mouth as needed for anxiety. Reported on 04/02/2015 09/01/2018: Normally takes when flying  . clindamycin-benzoyl peroxide (BENZACLIN) gel Apply 1 application topically daily as needed (acne).    . diphenhydrAMINE (BENADRYL) 25 MG tablet Take 25 mg by mouth daily as needed for allergies.   Marland Kitchen esomeprazole (NEXIUM) 20 MG capsule Take 20 mg by mouth daily as needed (acid reflux).   . hydroquinone 4 % cream Apply 1 application topically every evening.   Marland Kitchen ibuprofen (ADVIL) 200 MG tablet Take 400 mg by  mouth daily as needed for headache or moderate pain.   Marland Kitchen ipratropium (ATROVENT) 0.06 % nasal spray Place 1 spray into both nostrils 2 (two) times a day.    . levothyroxine (SYNTHROID, LEVOTHROID) 100 MCG tablet Take 100 mcg by mouth daily before breakfast.    . LEVOTHYROXINE SODIUM PO Take by mouth.   . montelukast (SINGULAIR) 10 MG tablet Take 10 mg by mouth every evening.    . montelukast (SINGULAIR) 10 MG tablet Take by mouth.   . Multiple Vitamin (MULTIVITAMIN WITH MINERALS) TABS tablet Take 1 tablet by mouth once a week.   . Olopatadine HCl (PATADAY) 0.2 % SOLN Place 1-2 drops into both eyes daily as needed (allergies).    Marland Kitchen OVER THE COUNTER MEDICATION Apply 1 application topically at bedtime. Topical vitamin c solution   . pseudoephedrine (SUDAFED) 30 MG tablet Take 30 mg by mouth at bedtime as needed for congestion.   . diazepam (VALIUM) 5 MG tablet Take 30 minutes to an hour before your MRI. (Patient not taking: Reported on 11/12/2018)   . fexofenadine (ALLEGRA) 180 MG tablet Take 180 mg by mouth every evening.    Norvel Richards 80 MCG/ACT AERS Place 1 spray into both nostrils at bedtime.     No facility-administered encounter medications on file as of 11/12/2018.     Allergies: No Known Allergies  Family History: Family History  Problem Relation Age of Onset  . Healthy Mother   . CAD Father   . Allergies Father   . Ulcerative colitis Father   . Tremor Father   . Neuropathy Neg Hx     Social History: Social History   Tobacco Use  . Smoking status: Never Smoker  . Smokeless tobacco: Never Used  Substance Use Topics  . Alcohol use: Yes    Comment: rare glass of wine  . Drug use: No   Social History   Social History Narrative   Mother of three. Husband works out of town often. She eats well and exercises regularly.    Two story home and right handed    Review of Systems:  CONSTITUTIONAL: No fevers, chills, night sweats, or weight loss.   EYES: No visual changes or eye  pain ENT: No hearing changes.  No history of nose bleeds.   RESPIRATORY: No cough, wheezing and shortness of breath.   CARDIOVASCULAR: Negative for chest pain, and palpitations.   GI: Negative for abdominal discomfort, blood in stools or black stools.  No recent change in bowel habits.   GU:  No history of incontinence.   MUSCLOSKELETAL: No history of joint pain or swelling.  No myalgias.   SKIN: Negative for lesions, rash, and itching.   HEMATOLOGY/ONCOLOGY: Negative for prolonged bleeding, bruising easily, and swollen nodes.  No history of cancer.   ENDOCRINE: Negative for cold or heat intolerance, polydipsia or goiter.   PSYCH:  No depression or anxiety symptoms.   NEURO: As Above.  Vital Signs:  BP 110/80   Pulse 97   Ht 5\' 8"  (1.727 m)   Wt 167 lb (75.8 kg)   SpO2 98%   BMI 25.39 kg/m    General Medical Exam:   General:  Well appearing, comfortable.   Eyes/ENT: see cranial nerve examination.   Neck:   No carotid bruits. Respiratory:  Clear to auscultation, good air entry bilaterally.   Cardiac:  Regular rate and rhythm, no murmur.   Extremities:  No deformities, edema, or skin discoloration.  Skin:  No rashes or lesions.  Neurological Exam: MENTAL STATUS including orientation to time, place, person, recent and remote memory, attention span and concentration, language, and fund of knowledge is normal.  Speech is not dysarthric.  CRANIAL NERVES: II:  No visual field defects.     III-IV-VI: Pupils equal round and reactive to light.  Normal conjugate, extra-ocular eye movements in all directions of gaze.  No nystagmus.  Very mild left ptosis at baseline without worsening with sustained upgaze.   V:  Reduced pin prick over the left cheek (V2), intact sensation on the left forehead and jawline as well as the right side of the face. Temperature and vibration is symmetric.     VII:  There is mild weakness of the left frontalis, orbicularis oculi, orbicularis oris (4+/5) and 4/5  left buccinator muscle. There is moderate facial droop on the left with smile, but there is evidence of muscle contraction.  No abnormal movements.   VIII:  Normal hearing and vestibular function.   IX-X:  Normal palatal movement.   XI:  Normal shoulder shrug and head rotation.   XII:  Normal tongue strength and range of motion, no deviation or fasciculation.  MOTOR: Motor strength is 5/5 throughout BUE and BLE.  No atrophy, fasciculations or abnormal movements.  No pronator drift.   MSRs:  Right        Left                  brachioradialis 2+  2+  biceps 2+  2+  triceps 2+  2+  patellar 2+  2+  ankle jerk 2+  2+  Hoffman no  no  plantar response down  down   SENSORY:  Normal and symmetric perception of light touch, pinprick, vibration, and proprioception.   COORDINATION/GAIT: Normal finger-to- nose-finger.  Intact rapid alternating movements bilaterally.  Gait narrow based and stable. Tandem and stressed gait intact.    IMPRESSION: 1. 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  She is requesting open MRI. Even though her clinical manifestation of a lower motor neuron cranial nerve VII palsy, as seen with Bell's palsy, paresthesias are atypical and therefore recommend imaging.   Further recommendations pending results.   Thank you for allowing me to participate in patient's care.  If I can answer any additional questions, I would be pleased to do so.    Sincerely,     K. Posey Pronto, DO

## 2018-11-15 DIAGNOSIS — H40013 Open angle with borderline findings, low risk, bilateral: Secondary | ICD-10-CM | POA: Diagnosis not present

## 2018-11-17 ENCOUNTER — Ambulatory Visit: Payer: BC Managed Care – PPO

## 2018-11-18 ENCOUNTER — Ambulatory Visit: Payer: BC Managed Care – PPO

## 2018-11-22 ENCOUNTER — Other Ambulatory Visit: Payer: Self-pay | Admitting: Obstetrics and Gynecology

## 2018-11-22 DIAGNOSIS — G510 Bell's palsy: Secondary | ICD-10-CM | POA: Diagnosis not present

## 2018-11-22 DIAGNOSIS — Z1231 Encounter for screening mammogram for malignant neoplasm of breast: Secondary | ICD-10-CM

## 2018-11-23 ENCOUNTER — Telehealth: Payer: Self-pay | Admitting: Neurology

## 2018-11-23 NOTE — Telephone Encounter (Signed)
MRI brain performed at [facility] dated 11/22/2018: 1.  No acute intracranial abnormality 2.  Enhancement fundal tuft, labyrinthine and proximal tympanic LEFT facial nerve that is consistent with the patient's known history of Bell's palsy.  Results discussed with patient.  She is doing much better and almost completely back to her baseline facial strength.  No other neurological symptoms.  I will see her back for follow-up on 12/14 at 10:30am, or sooner if she developed new symptoms.

## 2018-11-24 ENCOUNTER — Other Ambulatory Visit: Payer: Self-pay

## 2018-11-24 ENCOUNTER — Ambulatory Visit (INDEPENDENT_AMBULATORY_CARE_PROVIDER_SITE_OTHER): Payer: BC Managed Care – PPO

## 2018-11-24 DIAGNOSIS — Z23 Encounter for immunization: Secondary | ICD-10-CM | POA: Diagnosis not present

## 2018-12-07 ENCOUNTER — Telehealth: Payer: Self-pay | Admitting: Emergency Medicine

## 2018-12-14 NOTE — Telephone Encounter (Signed)
Reviewed patient's bill, [employer] made some sort of correction, and re-applied the payments but did not re-apply the adjustments, leaving the patient with a large bill.  Called [employer], agent I spoke to stated they were aware the adjustments were missing and it was "in queue" to be fixed.  Called the patient, left message on voicemail that this was in the process of being fixed, but that if she had any further questions she could call me back.

## 2019-01-07 ENCOUNTER — Other Ambulatory Visit: Payer: Self-pay | Admitting: Obstetrics and Gynecology

## 2019-01-07 ENCOUNTER — Other Ambulatory Visit: Payer: Self-pay

## 2019-01-07 ENCOUNTER — Ambulatory Visit
Admission: RE | Admit: 2019-01-07 | Discharge: 2019-01-07 | Disposition: A | Discharge status: Home / Self Care | Payer: BC Managed Care – PPO | Source: Ambulatory Visit | Attending: Obstetrics and Gynecology | Admitting: Obstetrics and Gynecology

## 2019-01-07 DIAGNOSIS — Z1231 Encounter for screening mammogram for malignant neoplasm of breast: Secondary | ICD-10-CM

## 2019-01-07 DIAGNOSIS — N6320 Unspecified lump in the left breast, unspecified quadrant: Secondary | ICD-10-CM

## 2019-01-07 DIAGNOSIS — R928 Other abnormal and inconclusive findings on diagnostic imaging of breast: Secondary | ICD-10-CM | POA: Diagnosis not present

## 2019-01-31 ENCOUNTER — Encounter: Payer: Self-pay | Admitting: Neurology

## 2019-02-07 ENCOUNTER — Ambulatory Visit: Payer: BC Managed Care – PPO | Admitting: Neurology

## 2019-02-08 ENCOUNTER — Other Ambulatory Visit: Payer: Self-pay

## 2019-02-09 ENCOUNTER — Encounter: Payer: BC Managed Care – PPO | Admitting: Physician Assistant

## 2019-02-16 ENCOUNTER — Ambulatory Visit: Payer: BC Managed Care – PPO | Attending: Internal Medicine

## 2019-02-16 DIAGNOSIS — Z20828 Contact with and (suspected) exposure to other viral communicable diseases: Secondary | ICD-10-CM | POA: Diagnosis not present

## 2019-02-16 DIAGNOSIS — Z20822 Contact with and (suspected) exposure to covid-19: Secondary | ICD-10-CM

## 2019-02-18 LAB — NOVEL CORONAVIRUS, NAA: SARS-CoV-2, NAA: NOT DETECTED

## 2019-03-04 DIAGNOSIS — C73 Malignant neoplasm of thyroid gland: Secondary | ICD-10-CM | POA: Diagnosis not present

## 2019-03-04 DIAGNOSIS — E890 Postprocedural hypothyroidism: Secondary | ICD-10-CM | POA: Diagnosis not present

## 2019-03-09 ENCOUNTER — Encounter: Payer: Self-pay | Admitting: Physician Assistant

## 2019-03-09 ENCOUNTER — Ambulatory Visit (INDEPENDENT_AMBULATORY_CARE_PROVIDER_SITE_OTHER): Payer: BC Managed Care – PPO | Admitting: Physician Assistant

## 2019-03-09 VITALS — BP 128/76 | HR 71 | Temp 98.0°F | Ht 68.0 in | Wt 170.0 lb

## 2019-03-09 DIAGNOSIS — J302 Other seasonal allergic rhinitis: Secondary | ICD-10-CM | POA: Diagnosis not present

## 2019-03-09 DIAGNOSIS — E890 Postprocedural hypothyroidism: Secondary | ICD-10-CM | POA: Diagnosis not present

## 2019-03-09 NOTE — Progress Notes (Signed)
Barbara Cardenas is a 49 y.o. female is here for transfer of care.  I acted as a Education administrator for [employer], PA-C Anselmo Pickler, LPN  History of Present Illness:   Chief Complaint  Patient presents with  . Transfer of care    HPI   Hypothyroidism -- currently on 100 mcg; sees endo at [facility]. Compliant with medications.  Allergies -- was doing allergy shots and was 3 shots away from her maintenance dosing, but had an episode of Bells Palsy in Sep 2020. Her shots were stopped during her Bells Palsy. She states that two weeks after she stopped her allergy shots started to feel overall better, less fatigue, more normal. She has been off of them for almost 6 months now and her allergist is recommending that they restart them. She is weary about this because she has been feeling much better. Continues on singulair, atrovent nasal spray, albuterol inhaler.   There are no preventive care reminders to display for this patient.  Past Medical History:  Diagnosis Date  . Anemia    during pregnancy  . Cancer of thyroid ([place]) 09/07/2015  . 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 GERD (gastroesophageal reflux disease)   . Headache   . Hypercholesteremia 12/21/2013  . Mediastinal lymphadenopathy   . Neuropathy   . Pneumonia   . Postoperative hypothyroidism 09/07/2015  . Seasonal allergies 07/20/2016  . Thyroid cancer ([place])   . Vision abnormalities      Social History   Socioeconomic History  . Marital status: Married    Spouse name: Not on file  . Number of children: 3  . Years of education: 12  . Highest education level: Not on file  Occupational History  . 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 Smoking status: Never Smoker  . Smokeless tobacco: Never Used  Substance and Sexual Activity  . Alcohol use: Yes    Comment: rare glass of wine  . Drug use: No  . Sexual activity: Yes  Other Topics Concern  . Not on file  Social History Narrative   Mother of three. Husband works out of town often  (home since [place] started).    She eats well and exercises regularly.    Two story home and right handed      Social Determinants of Health   Financial Resource Strain:   . Difficulty of Paying Living Expenses: Not on file  Food Insecurity:   . Worried About Charity fundraiser in the Last Year: Not on file  . Ran Out of Food in the Last Year: Not on file  Transportation Needs:   . Lack of Transportation (Medical): Not on file  . Lack of Transportation (Non-Medical): Not on file  Physical Activity:   . Days of Exercise per Week: Not on file  . Minutes of Exercise per Session: Not on file  Stress:   . Feeling of Stress : Not on file  Social Connections:   . Frequency of Communication with Friends and Family: Not on file  . Frequency of Social Gatherings with Friends and Family: Not on file  . Attends Religious Services: Not on file  . Active Member of Clubs or Organizations: Not on file  . Attends Archivist Meetings: Not on file  . Marital Status: Not on file  Intimate Partner Violence:   . Fear of Current or Ex-Partner: Not on file  . Emotionally Abused: Not on file  . Physically Abused: Not on file  .  Sexually Abused: Not on file    Past Surgical History:  Procedure Laterality Date  . DILATION AND CURETTAGE OF UTERUS    . THYROIDECTOMY, PARTIAL     full  . VIDEO BRONCHOSCOPY WITH ENDOBRONCHIAL ULTRASOUND N/A 09/08/2018   Procedure: VIDEO BRONCHOSCOPY WITH ENDOBRONCHIAL ULTRASOUND AND BIOPSIES;  Surgeon: Collene Gobble, MD;  Location: [facility] OR;  Service: Thoracic;  Laterality: N/A;    Family History  Problem Relation Age of Onset  . Healthy Mother   . CAD Father   . Allergies Father   . Ulcerative colitis Father   . Tremor Father   . Neuropathy Neg Hx     PMHx, SurgHx, SocialHx, FamHx, Medications, and Allergies were reviewed in the Visit Navigator and updated as appropriate.   Patient Active Problem List   Diagnosis Date Noted  . Mediastinal  lymphadenopathy 09/08/2018  . Abnormal CT of the chest 08/23/2018  . Allergic rhinitis due to animal (cat) (dog) hair and dander 03/08/2018  . GERD (gastroesophageal reflux disease) 03/08/2018  . Mild intermittent asthma 03/08/2018  . Other chronic allergic conjunctivitis 03/08/2018  . 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 Dysfunction of right eustachian tube 12/07/2016  . History of thyroid cancer 07/20/2016  . Seasonal allergies 07/20/2016  . 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 Postoperative hypothyroidism 09/07/2015  . Hypercholesteremia 12/21/2013    Social History   Tobacco Use  . Smoking status: Never Smoker  . Smokeless tobacco: Never Used  Substance Use Topics  . Alcohol use: Yes    Comment: rare glass of wine  . Drug use: No    Current Medications and Allergies:    Current Outpatient Medications:  .  acetaminophen (TYLENOL) 500 MG tablet, Take 1,000 mg by mouth daily as needed for moderate pain or headache., Disp: , Rfl:  .  albuterol (PROAIR HFA) 108 (90 Base) MCG/ACT inhaler, Inhale 2 puffs into the lungs every 6 (six) hours as needed for wheezing or shortness of breath. , Disp: , Rfl:  .  ALPRAZolam (XANAX) 0.5 MG tablet, Take 1 tablet (0.5 mg total) by mouth as needed for anxiety. Reported on 04/02/2015, Disp: 30 tablet, Rfl: 0 .  diphenhydrAMINE (BENADRYL) 25 MG tablet, Take 25 mg by mouth daily as needed for allergies., Disp: , Rfl:  .  esomeprazole (NEXIUM) 20 MG capsule, Take 20 mg by mouth daily as needed (acid reflux)., Disp: , Rfl:  .  fexofenadine (ALLEGRA) 180 MG tablet, Take 180 mg by mouth every evening. , Disp: , Rfl:  .  ibuprofen (ADVIL) 200 MG tablet, Take 400 mg by mouth daily as needed for headache or moderate pain., Disp: , Rfl:  .  ipratropium (ATROVENT) 0.06 % nasal spray, Place 1 spray into both nostrils 2 (two) times a day. , Disp: , Rfl:  .  levothyroxine (SYNTHROID, LEVOTHROID) 100 MCG tablet, Take 100 mcg by mouth daily  before breakfast. , Disp: , Rfl:  .  montelukast (SINGULAIR) 10 MG tablet, Take 10 mg by mouth every evening. , Disp: , Rfl:  .  Multiple Vitamin (MULTIVITAMIN WITH MINERALS) TABS tablet, Take 1 tablet by mouth once a week., Disp: , Rfl:  .  Olopatadine HCl (PATADAY) 0.2 % SOLN, Place 1-2 drops into both eyes daily as needed (allergies). , Disp: , Rfl:  .  OVER THE COUNTER MEDICATION, Apply 1 application topically at bedtime. Topical vitamin c solution, Disp: , Rfl:   No Known Allergies  Review of Systems   ROS  Negative  unless otherwise specified per HPI.  Vitals:   Vitals:   03/09/19 0957  BP: 128/76  Pulse: 71  Temp: 98 F (36.7 C)  TempSrc: Temporal  SpO2: 98%  Weight: 170 lb (77.1 kg)  Height: 5\' 8"  (1.727 m)     Body mass index is 25.85 kg/m.   Physical Exam:    Physical Exam Vitals and nursing note reviewed.  Constitutional:      General: She is not in acute distress.    Appearance: She is well-developed. She is not ill-appearing or toxic-appearing.  Cardiovascular:     Rate and Rhythm: Normal rate and regular rhythm.     Pulses: Normal pulses.     Heart sounds: Normal heart sounds, S1 normal and S2 normal.     Comments: No LE edema Pulmonary:     Effort: Pulmonary effort is normal.     Breath sounds: Normal breath sounds.  Skin:    General: Skin is warm and dry.  Neurological:     Mental Status: She is alert.     GCS: GCS eye subscore is 4. GCS verbal subscore is 5. GCS motor subscore is 6.  Psychiatric:        Speech: Speech normal.        Behavior: Behavior normal. Behavior is cooperative.      Assessment and Plan:    Barbara Cardenas was seen today for transfer of care.  Diagnoses and all orders for this visit:  Postoperative hypothyroidism Management per [facility].  Seasonal allergies Discussion regarding proceeding with allergy shots. Overall she feels better without them, so we decided to hold off on this and approach the spring season with her  current regimen and see how she does. If needs to resume allergy shots then we can.  . Reviewed expectations re: course of current medical issues. . Discussed self-management of symptoms. . Outlined signs and symptoms indicating need for more acute intervention. . Patient verbalized understanding and all questions were answered. . See orders for this visit as documented in the electronic medical record. . Patient received an After Visit Summary.  CMA or LPN served as scribe during this visit. History, Physical, and Plan performed by medical provider. The above documentation has been reviewed and is accurate and complete.  This appointment required 30 minutes of patient care (this includes precharting, chart review, review of results, face-to-face care, etc.).  [name], PA-C [place], [place] 03/09/2019  Follow-up: No follow-ups on file.

## 2019-03-14 ENCOUNTER — Other Ambulatory Visit: Payer: BC Managed Care – PPO

## 2019-04-01 DIAGNOSIS — C73 Malignant neoplasm of thyroid gland: Secondary | ICD-10-CM | POA: Diagnosis not present

## 2019-04-01 DIAGNOSIS — E890 Postprocedural hypothyroidism: Secondary | ICD-10-CM | POA: Diagnosis not present

## 2019-04-01 DIAGNOSIS — Z85850 Personal history of malignant neoplasm of thyroid: Secondary | ICD-10-CM | POA: Diagnosis not present

## 2019-04-06 DIAGNOSIS — J3089 Other allergic rhinitis: Secondary | ICD-10-CM | POA: Diagnosis not present

## 2019-04-06 DIAGNOSIS — K219 Gastro-esophageal reflux disease without esophagitis: Secondary | ICD-10-CM | POA: Diagnosis not present

## 2019-04-06 DIAGNOSIS — J3081 Allergic rhinitis due to animal (cat) (dog) hair and dander: Secondary | ICD-10-CM | POA: Diagnosis not present

## 2019-04-06 DIAGNOSIS — J301 Allergic rhinitis due to pollen: Secondary | ICD-10-CM | POA: Diagnosis not present

## 2019-04-07 DIAGNOSIS — Z01419 Encounter for gynecological examination (general) (routine) without abnormal findings: Secondary | ICD-10-CM | POA: Diagnosis not present

## 2019-05-24 ENCOUNTER — Encounter: Payer: Self-pay | Admitting: Neurology

## 2019-05-24 ENCOUNTER — Ambulatory Visit (INDEPENDENT_AMBULATORY_CARE_PROVIDER_SITE_OTHER): Payer: BC Managed Care – PPO | Admitting: Neurology

## 2019-05-24 ENCOUNTER — Other Ambulatory Visit: Payer: Self-pay

## 2019-05-24 VITALS — BP 133/72 | HR 79 | Resp 18 | Ht 68.0 in | Wt 176.0 lb

## 2019-05-24 DIAGNOSIS — G510 Bell's palsy: Secondary | ICD-10-CM | POA: Diagnosis not present

## 2019-05-24 NOTE — Progress Notes (Signed)
Follow-up Visit   Date: 05/24/19   Barbara Cardenas MRN: [id number] DOB: 02/07/1971   Interim History: Barbara Cardenas is a 49 y.o. right-handed Caucasian female with thyroid cancer s/p thyroidectomy and allergies returning to the clinic for follow-up of left facial weakness.  The patient was accompanied to the clinic by self.  History of present illness: On the night of 8/30, she was applying lip gloss and noticed that her lip was weak, stating "it didn't work right" associated with numbness over the left lower lip.  The following morning on 8/31, she noticed that her blink on the left eye was asymmetric, left facial weakness, difficulty with chewing, and numbness over the left cheek.  She went to the ER on 10/25/2018 where CT/A head was normal.  MRI brain was recommended, but due to claustrophobia, she elected not to have it.  She was evaluated by her PCP on 9/2 who has ordered MRI brain and started on prednisone 60mg  x 7 days.  She has noticed that her smile has improved and tongue tingling has significantly improved.  She has no problems with swallowing or chewing.  She still has weakness over the left cheek and unable to puff the cheek or use a straw on that side.   UPDATE 05/24/19: She is here for follow-up visit for left Bell's palsy.  Her symptoms have markedly improved and she denies any numbness or weakness over the left side of the face.  Occasionally, she has continued left-sided, which self resolved.  No new neurological concerns.   Medications:  Current Outpatient Medications on File Prior to Visit  Medication Sig Dispense Refill  . acetaminophen (TYLENOL) 500 MG tablet Take 1,000 mg by mouth daily as needed for moderate pain or headache.    . albuterol (PROAIR HFA) 108 (90 Base) MCG/ACT inhaler Inhale 2 puffs into the lungs every 6 (six) hours as needed for wheezing or shortness of breath.     . ALPRAZolam (XANAX) 0.5 MG tablet Take 1 tablet (0.5 mg total) by mouth as  needed for anxiety. Reported on 04/02/2015 30 tablet 0  . diphenhydrAMINE (BENADRYL) 25 MG tablet Take 25 mg by mouth daily as needed for allergies.    Marland Kitchen esomeprazole (NEXIUM) 20 MG capsule Take 20 mg by mouth daily as needed (acid reflux).    . fexofenadine (ALLEGRA) 180 MG tablet Take 180 mg by mouth every evening.     Marland Kitchen ibuprofen (ADVIL) 200 MG tablet Take 400 mg by mouth daily as needed for headache or moderate pain.    Marland Kitchen ipratropium (ATROVENT) 0.06 % nasal spray Place 1 spray into both nostrils 2 (two) times a day.     . levothyroxine (SYNTHROID, LEVOTHROID) 100 MCG tablet Take 100 mcg by mouth daily before breakfast.     . montelukast (SINGULAIR) 10 MG tablet Take 10 mg by mouth every evening.     . Multiple Vitamin (MULTIVITAMIN WITH MINERALS) TABS tablet Take 1 tablet by mouth once a week.    . Olopatadine HCl (PATADAY) 0.2 % SOLN Place 1-2 drops into both eyes daily as needed (allergies).     . Olopatadine HCl 0.6 % SOLN Place 2 sprays into both nostrils 2 (two) times daily.    Marland Kitchen OVER THE COUNTER MEDICATION Apply 1 application topically at bedtime. Topical vitamin c solution     No current facility-administered medications on file prior to visit.    Allergies: No Known Allergies  Vital Signs:  BP 133/72  Pulse 79   Resp 18   Ht 5\' 8"  (1.727 m)   Wt 176 lb (79.8 kg)   SpO2 98%   BMI 26.76 kg/m     Neurological Exam: MENTAL STATUS including orientation to time, place, person, recent and remote memory, attention span and concentration, language, and fund of knowledge is normal.  Speech is not dysarthric.  CRANIAL NERVES:  No visual field defects.  Pupils equal round and reactive to light.  Normal conjugate, extra-ocular eye movements in all directions of gaze.  No ptosis.  Face is symmetric, facial muscles are 5/5. Tongue is midline.  MOTOR:  Motor strength is 5/5 in all extremities.  No atrophy, fasciculations or abnormal movements.  No pronator drift.  Tone is normal.     COORDINATION/GAIT:   Gait narrow based and stable.   Data: MRI brain performed at [facility] dated 11/22/2018: 1.  No acute intracranial abnormality 2.  Enhancement fundal tuft, labyrinthine and proximal tympanic LEFT facial nerve that is consistent with the patient's known history of Bell's palsy.  IMPRESSION/PLAN: Left Bell's palsy, resolved.  I do not appreciate any ongoing weakness on her neurological exam today. She has synkinesis and patient was reassured that abnormal facial movements are signs of reinnervation.  She will be having her first Covid vaccine and I did inform her that population studies did show incidence of Bell's palsy in both cohorts who received the vaccination and who did not, suggesting that there is no increased risk of Bell's palsy with the vaccine, but it is known adverse effect.  Return to clinic as needed  Thank you for allowing me to participate in patient's care.  If I can answer any additional questions, I would be pleased to do so.    Sincerely,    Tamer Baughman K. Posey Pronto, DO

## 2019-05-26 ENCOUNTER — Ambulatory Visit (INDEPENDENT_AMBULATORY_CARE_PROVIDER_SITE_OTHER): Payer: BC Managed Care – PPO | Admitting: Family Medicine

## 2019-05-26 ENCOUNTER — Ambulatory Visit (INDEPENDENT_AMBULATORY_CARE_PROVIDER_SITE_OTHER): Payer: BC Managed Care – PPO

## 2019-05-26 ENCOUNTER — Other Ambulatory Visit: Payer: Self-pay

## 2019-05-26 ENCOUNTER — Encounter: Payer: Self-pay | Admitting: Family Medicine

## 2019-05-26 VITALS — BP 110/74 | HR 79 | Temp 98.0°F | Ht 68.0 in | Wt 175.8 lb

## 2019-05-26 DIAGNOSIS — M791 Myalgia, unspecified site: Secondary | ICD-10-CM | POA: Diagnosis not present

## 2019-05-26 DIAGNOSIS — R918 Other nonspecific abnormal finding of lung field: Secondary | ICD-10-CM | POA: Diagnosis not present

## 2019-05-26 DIAGNOSIS — R61 Generalized hyperhidrosis: Secondary | ICD-10-CM

## 2019-05-26 LAB — COMPREHENSIVE METABOLIC PANEL
ALT: 14 U/L (ref 0–35)
AST: 14 U/L (ref 0–37)
Albumin: 4.5 g/dL (ref 3.5–5.2)
Alkaline Phosphatase: 89 U/L (ref 39–117)
BUN: 13 mg/dL (ref 6–23)
CO2: 31 mEq/L (ref 19–32)
Calcium: 10 mg/dL (ref 8.4–10.5)
Chloride: 103 mEq/L (ref 96–112)
Creatinine, Ser: 0.67 mg/dL (ref 0.40–1.20)
GFR: 94.12 mL/min (ref >60.00)
Glucose, Bld: 91 mg/dL (ref 70–99)
Potassium: 3.7 mEq/L (ref 3.5–5.1)
Sodium: 141 mEq/L (ref 135–145)
Total Bilirubin: 0.6 mg/dL (ref 0.2–1.2)
Total Protein: 7.3 g/dL (ref 6.0–8.3)

## 2019-05-26 LAB — CBC WITH DIFFERENTIAL/PLATELET
Basophils Absolute: 0 10*3/uL (ref 0.0–0.1)
Basophils Relative: 0.3 % (ref 0.0–3.0)
Eosinophils Absolute: 0.2 10*3/uL (ref 0.0–0.7)
Eosinophils Relative: 3.2 % (ref 0.0–5.0)
HCT: 40 % (ref 36.0–46.0)
Hemoglobin: 13.3 g/dL (ref 12.0–15.0)
Lymphocytes Relative: 16.9 % (ref 12.0–46.0)
Lymphs Abs: 0.9 10*3/uL (ref 0.7–4.0)
MCHC: 33.1 g/dL (ref 30.0–36.0)
MCV: 86.3 fl (ref 78.0–100.0)
Monocytes Absolute: 0.5 10*3/uL (ref 0.1–1.0)
Monocytes Relative: 9.1 % (ref 3.0–12.0)
Neutro Abs: 3.9 10*3/uL (ref 1.4–7.7)
Neutrophils Relative %: 70.5 % (ref 43.0–77.0)
Platelets: 243 10*3/uL (ref 150.0–400.0)
RBC: 4.64 Mil/uL (ref 3.87–5.11)
RDW: 14 % (ref 11.5–15.5)
WBC: 5.6 10*3/uL (ref 4.0–10.5)

## 2019-05-26 LAB — C-REACTIVE PROTEIN: CRP: <1 mg/dL (ref 0.5–20.0)

## 2019-05-26 LAB — SEDIMENTATION RATE: Sed Rate: 23 mm/hr — ABNORMAL HIGH (ref 0–20)

## 2019-05-26 LAB — T3, FREE: T3, Free: 3.6 pg/mL (ref 2.3–4.2)

## 2019-05-26 LAB — TSH: TSH: 1.98 u[IU]/mL (ref 0.35–4.50)

## 2019-05-26 LAB — T4, FREE: Free T4: 0.95 ng/dL (ref 0.60–1.60)

## 2019-05-26 NOTE — Progress Notes (Signed)
Patient: Barbara Cardenas MRN: [id number] DOB: 11-02-1970 PCP: [name], PA     Subjective:  Chief Complaint  Patient presents with  . Night Sweats  . Breathing Problem    HPI: The patient is a 49 y.o. female who presents today for Night Sweats. She states she first noticed this about 3 weeks ago. She was having regular periods until about 2-3 months ago. She does have hot flashes during the day intermittently, but it's much worse at night and keeps her up. Its hard for her to go back to sleep. She has soaked the sheets one night, but most of the times she Is just hot. She is also wondering if her thyroid is off. She states her heart rate has been elevated some to 100. She has no diarrhea and no increased anxiety from her baseline. She denies excessive weight loss, bruising, bleeding, blood in stool, lymphadenopathy. No international travel or risk of TB. Sexually active with her husband only for over 20 years with negative hiv in 2017.   She also has some pain on her left lateral side around her lat. Feels the same as when she had pneumonia, but she hasn't been sick. Has pain with big deep breaths. Has started to do push ups unsure if contributed. No pain with arm movement.   Review of Systems  Constitutional: Negative for chills, fatigue, fever and unexpected weight change.  HENT: Negative for congestion, sneezing and sore throat.   Respiratory: Negative for cough, shortness of breath and wheezing.   Gastrointestinal: Negative for abdominal pain, blood in stool, diarrhea, nausea and vomiting.  Endocrine: Positive for heat intolerance.       Night sweats   Musculoskeletal: Negative for arthralgias.  Allergic/Immunologic: Positive for environmental allergies.  Neurological: Negative for dizziness, light-headedness and headaches.  Hematological: Negative for adenopathy. Does not bruise/bleed easily.    Allergies Patient has No Known Allergies.  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), and Vision abnormalities.  Surgical History Patient  has a past surgical history that includes Thyroidectomy, partial; Dilation and curettage of uterus; and Video bronchoscopy with endobronchial ultrasound (N/A, 09/08/2018).  Family History Pateint's family history includes Allergies in her father; CAD in her father; Healthy in her mother; Tremor in her father; Ulcerative colitis in her father.  Social History Patient  reports that she has never smoked. She has never used smokeless tobacco. She reports current alcohol use. She reports that she does not use drugs.    Objective: Vitals:   05/26/19 1300  BP: 110/74  Pulse: 79  Temp: 98 F (36.7 C)  TempSrc: Temporal  SpO2: 99%  Weight: 175 lb 12.8 oz (79.7 kg)  Height: 5\' 8"  (1.727 m)    Body mass index is 26.73 kg/m.  Physical Exam Vitals reviewed.  Constitutional:      Appearance: Normal appearance. She is well-developed and normal weight.  HENT:     Head: Normocephalic and atraumatic.     Right Ear: Tympanic membrane and external ear normal.     Left Ear: External ear normal.     Mouth/Throat:     Mouth: Mucous membranes are moist.  Eyes:     Extraocular Movements: Extraocular movements intact.     Conjunctiva/sclera: Conjunctivae normal.     Pupils: Pupils are equal,  round, and reactive to light.  Neck:     Thyroid: No thyromegaly.  Cardiovascular:     Rate and Rhythm: Normal rate and regular rhythm.     Heart sounds: Normal heart sounds. No murmur.  Pulmonary:     Effort: Pulmonary effort is normal. No respiratory distress.     Breath sounds: Normal breath sounds. No wheezing.  Abdominal:      General: Abdomen is flat. Bowel sounds are normal. There is no distension.     Palpations: Abdomen is soft.     Tenderness: There is no abdominal tenderness.  Musculoskeletal:     Cervical back: Normal range of motion and neck supple.     Comments: TTP over right lateral lat muscle superior aspect   Lymphadenopathy:     Cervical: No cervical adenopathy.  Skin:    General: Skin is warm and dry.     Capillary Refill: Capillary refill takes less than 2 seconds.     Findings: No bruising or rash.  Neurological:     General: No focal deficit present.     Mental Status: She is alert and oriented to person, place, and time.     Cranial Nerves: No cranial nerve deficit.     Coordination: Coordination normal.     Deep Tendon Reflexes: Reflexes normal.  Psychiatric:        Mood and Affect: Mood normal.        Behavior: Behavior normal.    CXR: no acute findings. Stable from 03/2018. Reviewed by myself.     Assessment/plan: 1. Night sweats Likely secondary to peri menopause and discussed with her that this is usually a non malignant issue. Will also make sure her thyroid levels are wnl as they were off when she was last seen at [facility] and medication was adjusted.  I will; however, do the work up for night sweats. Discussed if due to peri menopause could do low dose birth control as she doesn't appear to have any contraindications or we could do trial of gabapentin. Will get labs back first.  - CBC with Differential/Platelet - Comprehensive metabolic panel - TSH - T4, free - T3, free - FSH/LH - C-reactive protein - Sedimentation rate - DG Chest 2 View; Future - Urine Culture - Urinalysis, Routine w reflex microscopic; Future  2. Muscle pain CXR with no acute findings and exam seems more consistent with strain over lat. Continue to monitor and f/u with pcp if worsening pain.     This visit occurred during the SARS-CoV-2 public health emergency.  Safety protocols were in place,  including screening questions prior to the visit, additional usage of staff PPE, and extensive cleaning of exam room while observing appropriate contact time as indicated for disinfecting solutions.     Return if symptoms worsen or fail to improve.   Orma Flaming, MD [place]   05/26/2019

## 2019-05-26 NOTE — Patient Instructions (Signed)
-  doing big work up on night sweats, but most likely due to hormones, peri menopausal.   Lets get labs back before medication. Can put you on low dose birth control during peri menopause or if don't want to do hormones can do gabapentin which helps with sleep.   So nice to meet you!  Dr. Rogers Blocker

## 2019-05-27 LAB — URINE CULTURE
MICRO NUMBER:: 10318014
SPECIMEN QUALITY:: ADEQUATE

## 2019-05-27 LAB — FSH/LH
FSH: 87.7 m[IU]/mL
LH: 44 m[IU]/mL

## 2019-05-30 LAB — URINALYSIS, ROUTINE W REFLEX MICROSCOPIC
Bilirubin Urine: NEGATIVE
Hgb urine dipstick: NEGATIVE
Ketones, ur: NEGATIVE
Leukocytes,Ua: NEGATIVE
Nitrite: NEGATIVE
RBC / HPF: NONE SEEN (ref 0–?)
Specific Gravity, Urine: 1.02 (ref 1.000–1.030)
Total Protein, Urine: NEGATIVE
Urine Glucose: NEGATIVE
Urobilinogen, UA: 0.2 (ref 0.0–1.0)
pH: 6 (ref 5.0–8.0)

## 2019-05-30 NOTE — Addendum Note (Signed)
Addended by: Christiana Fuchs on: 05/30/2019 09:09 AM   Modules accepted: Orders

## 2019-06-02 ENCOUNTER — Ambulatory Visit: Payer: BC Managed Care – PPO | Admitting: Neurology

## 2019-06-13 DIAGNOSIS — E890 Postprocedural hypothyroidism: Secondary | ICD-10-CM | POA: Diagnosis not present

## 2019-08-04 ENCOUNTER — Ambulatory Visit (INDEPENDENT_AMBULATORY_CARE_PROVIDER_SITE_OTHER): Payer: BC Managed Care – PPO | Admitting: Family Medicine

## 2019-08-04 ENCOUNTER — Encounter: Payer: Self-pay | Admitting: Family Medicine

## 2019-08-04 ENCOUNTER — Other Ambulatory Visit: Payer: Self-pay

## 2019-08-04 VITALS — BP 110/78 | HR 82 | Temp 97.8°F | Wt 181.0 lb

## 2019-08-04 DIAGNOSIS — L089 Local infection of the skin and subcutaneous tissue, unspecified: Secondary | ICD-10-CM

## 2019-08-04 DIAGNOSIS — L729 Follicular cyst of the skin and subcutaneous tissue, unspecified: Secondary | ICD-10-CM

## 2019-08-04 MED ORDER — SULFAMETHOXAZOLE-TRIMETHOPRIM 800-160 MG PO TABS: 1.0000 | ORAL_TABLET | Freq: Two times a day (BID) | ORAL | 0 refills | Status: AC

## 2019-08-04 NOTE — Progress Notes (Signed)
Subjective:    Patient ID: Lestine Box, female    DOB: 1970-10-16, 49 y.o.   MRN: [id number]  No chief complaint on file.   HPI Patient was seen for acute concern.  Pt notes h/o cyst in medial L breast x 6-7 months.  Noted on mammogram as a benign cyst that at some point should be removed.  Pt notes erythema, pain, and increased warmth of skin in area that woke her up over night.  Pt denies fever, chills, n/v.  Pt wants area removed.  Has plans to sit by the pool in a few days.  Past Medical History:  Diagnosis Date  . Anemia    during pregnancy  . Cancer of thyroid ([place]) 09/07/2015  . 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 GERD (gastroesophageal reflux disease)   . Headache   . Hypercholesteremia 12/21/2013  . Mediastinal lymphadenopathy   . Neuropathy   . Pneumonia   . Postoperative hypothyroidism 09/07/2015  . Seasonal allergies 07/20/2016  . Thyroid cancer ([place])   . Vision abnormalities     No Known Allergies  ROS General: Denies fever, chills, night sweats, changes in weight, changes in appetite HEENT: Denies headaches, ear pain, changes in vision, rhinorrhea, sore throat CV: Denies CP, palpitations, SOB, orthopnea Pulm: Denies SOB, cough, wheezing GI: Denies abdominal pain, nausea, vomiting, diarrhea, constipation GU: Denies dysuria, hematuria, frequency, vaginal discharge Msk: Denies muscle cramps, joint pains Neuro: Denies weakness, numbness, tingling Skin: Denies rashes, bruising   +painful cyst in L breast Psych: Denies depression, anxiety, hallucinations    Objective:    Blood pressure 110/78, pulse 82, temperature 97.8 F (36.6 C), temperature source Temporal, weight 181 lb (82.1 kg), SpO2 98 %.  Gen. Pleasant, well-nourished, in no distress, normal affect   HEENT: [place]/AT, face symmetric, no scleral icterus, PERRLA, EOMI, nares patent without drainage Lungs: no accessory muscle use Cardiovascular: RRR, no peripheral edema Neuro:  A&Ox3, CN II-XII intact, normal  gait Skin:  Warm, dry, intact.  L lower medial breast proximal to sternum with erythema and mild streaking, edema, TTP of rounded semi fluctuant mass with induration surrounding   Wt Readings from Last 3 Encounters:  08/04/19 181 lb (82.1 kg)  05/26/19 175 lb 12.8 oz (79.7 kg)  05/24/19 176 lb (79.8 kg)    Lab Results  Component Value Date   WBC 5.6 05/26/2019   HGB 13.3 05/26/2019   HCT 40.0 05/26/2019   PLT 243.0 05/26/2019   GLUCOSE 91 05/26/2019   CHOL 195 08/14/2017   TRIG 102.0 08/14/2017   HDL 60.10 08/14/2017   LDLCALC 115 (H) 08/14/2017   ALT 14 05/26/2019   AST 14 05/26/2019   NA 141 05/26/2019   K 3.7 05/26/2019   CL 103 05/26/2019   CREATININE 0.67 05/26/2019   BUN 13 05/26/2019   CO2 31 05/26/2019   TSH 1.98 05/26/2019   INR 1.0 09/08/2018   HGBA1C 5.3 08/14/2017    Incision and Drainage Procedure Note  Pre-operative Diagnosis: infected cyst  Post-operative Diagnosis: same  Anesthesia: 2% lidocaine with epinephrine  Procedure Details  The procedure, risks and complications have been discussed in detail (including, but not limited to airway compromise, infection, bleeding) with the patient, and the patient has signed consent to the procedure.  The skin was sterilely prepped and draped over the affected area in the usual fashion. After adequate local anesthesia, I&D with a #11 blade was performed on the left, medial breast.  Loculations noted.  Curved hemostats used to  break up loculations.  Thick purulent drainage: present.  Cyst sack removed in pieces.  Cyst cavity packed with sterile packing. The patient was observed until stable.  EBL: minimal cc's  Condition: Tolerated procedure well and Stable   Complications: none.   Assessment/Plan:  Infected cyst of skin -consent obtained.  I&D performed.  Pt tolerated procedure well.  See procedure note above -cyst deeper than appeared.  Cavity packed. -given handouts -given precautions.  Advised  ecchymosis likely given fair skin and pressure applied to area. -pt advised to remove packing in a few days.  - Plan: sulfamethoxazole-trimethoprim (BACTRIM DS) 800-160 MG tablet  F/u in 4 days (Monday)  Grier Mitts, MD

## 2019-08-04 NOTE — Patient Instructions (Signed)
Epidermal Cyst  An epidermal cyst is a sac made of skin tissue. The sac contains a substance called keratin. Keratin is a protein that is normally secreted through the hair follicles. When keratin becomes trapped in the top layer of skin (epidermis), it can form an epidermal cyst. Epidermal cysts can be found anywhere on your body. These cysts are usually harmless (benign), and they may not cause symptoms unless they become infected. What are the causes? This condition may be caused by:  A blocked hair follicle.  A hair that curls and re-enters the skin instead of growing straight out of the skin (ingrown hair).  A blocked pore.  Irritated skin.  An injury to the skin.  Certain conditions that are passed along from parent to child (inherited).  Human papillomavirus (HPV).  Long-term (chronic) sun damage to the skin. What increases the risk? The following factors may make you more likely to develop an epidermal cyst:  Having acne.  Being overweight.  Being 30-40 years old. What are the signs or symptoms? The only symptom of this condition may be a small, painless lump underneath the skin. When an epidermal cyst ruptures, it may become infected. Symptoms may include:  Redness.  Inflammation.  Tenderness.  Warmth.  Fever.  Keratin draining from the cyst. Keratin is grayish-white, bad-smelling substance.  Pus draining from the cyst. How is this diagnosed? This condition is diagnosed with a physical exam.  In some cases, you may have a sample of tissue (biopsy) taken from your cyst to be examined under a microscope or tested for bacteria.  You may be referred to a health care provider who specializes in skin care (dermatologist). How is this treated? In many cases, epidermal cysts go away on their own without treatment. If a cyst becomes infected, treatment may include:  Opening and draining the cyst, done by a health care provider. After draining, minor surgery to  remove the rest of the cyst may be done.  Antibiotic medicine.  Injections of medicines (steroids) that help to reduce inflammation.  Surgery to remove the cyst. Surgery may be done if the cyst: ? Becomes large. ? Bothers you. ? Has a chance of turning into cancer.  Do not try to open a cyst yourself. Follow these instructions at home:  Take over-the-counter and prescription medicines only as told by your health care provider.  If you were prescribed an antibiotic medicine, take it it as told by your health care provider. Do not stop using the antibiotic even if you start to feel better.  Keep the area around your cyst clean and dry.  Wear loose, dry clothing.  Avoid touching your cyst.  Check your cyst every day for signs of infection. Check for: ? Redness, swelling, or pain. ? Fluid or blood. ? Warmth. ? Pus or a bad smell.  Keep all follow-up visits as told by your health care provider. This is important. How is this prevented?  Wear clean, dry, clothing.  Avoid wearing tight clothing.  Keep your skin clean and dry. Take showers or baths every day. Contact a health care provider if:  Your cyst develops symptoms of infection.  Your condition is not improving or is getting worse.  You develop a cyst that looks different from other cysts you have had.  You have a fever. Get help right away if:  Redness spreads from the cyst into the surrounding area. Summary  An epidermal cyst is a sac made of skin tissue. These cysts are   usually harmless (benign), and they may not cause symptoms unless they become infected.  If a cyst becomes infected, treatment may include surgery to open and drain the cyst, or to remove it. Treatment may also include medicines by mouth or through an injection.  Take over-the-counter and prescription medicines only as told by your health care provider. If you were prescribed an antibiotic medicine, take it as told by your health care  provider. Do not stop using the antibiotic even if you start to feel better.  Contact a health care provider if your condition is not improving or is getting worse.  Keep all follow-up visits as told by your health care provider. This is important. This information is not intended to replace advice given to you by your health care provider. Make sure you discuss any questions you have with your health care provider. Document Revised: 06/03/2018 Document Reviewed: 08/24/2017 Elsevier Patient Education  [place].  Epidermal Cyst Removal, Care After This sheet gives you information about how to care for yourself after your procedure. Your health care provider may also give you more specific instructions. If you have problems or questions, contact your health care provider. What can I expect after the procedure? After the procedure, it is common to have:  Soreness in the area where your cyst was removed.  Tightness or itchiness from the stitches (sutures) in your skin. Follow these instructions at home: Medicines  Take over-the-counter and prescription medicines only as told by your health care provider.  If you were prescribed an antibiotic medicine or ointment, take or apply it as told by your health care provider. Do not stop using the antibiotic even if you start to feel better. Incision care   Follow instructions from your health care provider about how to take care of your incision. Make sure you: ? Wash your hands with soap and water before you change your bandage (dressing). If soap and water are not available, use hand sanitizer. ? Change your dressing as told by your health care provider. ? Leave sutures, skin glue, or adhesive strips in place. These skin closures may need to stay in place for 1-2 weeks or longer. If adhesive strip edges start to loosen and curl up, you may trim the loose edges. Do not remove adhesive strips completely unless your health care provider  tells you to do that.  Keep the dressingdry until your health care provider says that it can be removed.  After your dressing is off, check your incision area every day for signs of infection. Check for: ? Redness, swelling, or pain. ? Fluid or blood. ? Warmth. ? Pus or a bad smell. General instructions  Do not take baths, swim, or use a hot tub until your health care provider approves. Ask your health care provider if you may take showers. You may only be allowed to take sponge baths.  Your health care provider may ask you to avoid contact sports or activities that take a lot of effort. Do not do anything that stretches or puts pressure on your incision.  You can return to your normal diet.  Keep all follow-up visits as told by your health care provider. This is important. Contact a health care provider if:  You have a fever.  You have redness, swelling, or pain in the incision area.  You have fluid or blood coming from your incision.  You have pus or a bad smell coming from your incision.  Your incision feels warm to  the touch.  Your cyst grows back. Summary  After the procedure, it is common to have soreness in the area where your cyst was removed.  Take or apply over-the-counter and prescription medicines only as told by your health care provider.  Follow instructions from your health care provider about how to take care of your incision. This information is not intended to replace advice given to you by your health care provider. Make sure you discuss any questions you have with your health care provider. Document Revised: 06/02/2017 Document Reviewed: 12/04/2016 Elsevier Patient Education  2020 Elsevier Inc.  

## 2019-08-05 ENCOUNTER — Other Ambulatory Visit: Payer: Self-pay

## 2019-08-07 ENCOUNTER — Encounter: Payer: Self-pay | Admitting: Family Medicine

## 2019-08-08 ENCOUNTER — Other Ambulatory Visit: Payer: Self-pay

## 2019-08-08 ENCOUNTER — Encounter: Payer: Self-pay | Admitting: Family Medicine

## 2019-08-08 ENCOUNTER — Ambulatory Visit: Payer: BC Managed Care – PPO | Admitting: Family Medicine

## 2019-08-08 VITALS — BP 106/70 | HR 87 | Temp 98.3°F | Ht 68.0 in | Wt 180.0 lb

## 2019-08-08 DIAGNOSIS — L089 Local infection of the skin and subcutaneous tissue, unspecified: Secondary | ICD-10-CM

## 2019-08-08 DIAGNOSIS — L729 Follicular cyst of the skin and subcutaneous tissue, unspecified: Secondary | ICD-10-CM

## 2019-08-08 MED ORDER — SULFAMETHOXAZOLE-TRIMETHOPRIM 800-160 MG PO TABS: 1.0000 | ORAL_TABLET | Freq: Two times a day (BID) | ORAL | 0 refills | Status: AC

## 2019-08-08 NOTE — Progress Notes (Signed)
Subjective:    Patient ID: Barbara Cardenas, female    DOB: 10-Aug-1970, 49 y.o.   MRN: [id number]  Chief Complaint  Patient presents with   Cyst    Pt f/u in cyst. pt is taking abx. pt stated yellow pus and redness noticed.     HPI Patient was seen today for f/u.  Pt seen 6/10 for infected cyst of L breast.  Pt s/p I and D.  Taking bactrim BID x 7 days.  Notes improvement in area as of yesterday.  Took packing out on Saturday as the area was itching.  Erythema is improving.  Notes mild yello/red thin purulent drainage and some firmness surrounding it.  Denies fever, chills, n/v, diarrhea or pain.  Past Medical History:  Diagnosis Date   Anemia    during pregnancy   Cancer of thyroid ([place]) 09/07/2015   Fear of flying 07/20/2016   GERD (gastroesophageal reflux disease)    Headache    Hypercholesteremia 12/21/2013   Mediastinal lymphadenopathy    Neuropathy    Pneumonia    Postoperative hypothyroidism 09/07/2015   Seasonal allergies 07/20/2016   Thyroid cancer ([place])    Vision abnormalities     No Known Allergies  ROS General: Denies fever, chills, night sweats, changes in weight, changes in appetite HEENT: Denies headaches, ear pain, changes in vision, rhinorrhea, sore throat CV: Denies CP, palpitations, SOB, orthopnea Pulm: Denies SOB, cough, wheezing GI: Denies abdominal pain, nausea, vomiting, diarrhea, constipation GU: Denies dysuria, hematuria, frequency, vaginal discharge Msk: Denies muscle cramps, joint pains Neuro: Denies weakness, numbness, tingling Skin: Denies rashes, bruising  +L breast s/p I&D of cysts with drainage and induration Psych: Denies depression, anxiety, hallucinations     Objective:    Blood pressure 106/70, pulse 87, temperature 98.3 F (36.8 C), temperature source Other (Comment), height 5\' 8"  (1.727 m), weight 180 lb (81.6 kg), SpO2 98 %.   Gen. Pleasant, well-nourished, in no distress, normal affect   HEENT: [place]/AT, face  symmetric, no scleral icterus, PERRLA, EOMI, nares patent without drainage Lungs: no accessory muscle use Cardiovascular: RRR, no peripheral edema Neuro:  A&Ox3, CN II-XII intact, normal gait Skin:  Warm, dry, intact.  L lower medial breast with small open incision, no strike through noted on bandage.  Thin yellowis and serosaguinous fluid expressed from area with a few clumps noted.  Mild induration noted in surrounding tissue.  Wt Readings from Last 3 Encounters:  08/08/19 180 lb (81.6 kg)  08/04/19 181 lb (82.1 kg)  05/26/19 175 lb 12.8 oz (79.7 kg)    Lab Results  Component Value Date   WBC 5.6 05/26/2019   HGB 13.3 05/26/2019   HCT 40.0 05/26/2019   PLT 243.0 05/26/2019   GLUCOSE 91 05/26/2019   CHOL 195 08/14/2017   TRIG 102.0 08/14/2017   HDL 60.10 08/14/2017   LDLCALC 115 (H) 08/14/2017   ALT 14 05/26/2019   AST 14 05/26/2019   NA 141 05/26/2019   K 3.7 05/26/2019   CL 103 05/26/2019   CREATININE 0.67 05/26/2019   BUN 13 05/26/2019   CO2 31 05/26/2019   TSH 1.98 05/26/2019   INR 1.0 09/08/2018   HGBA1C 5.3 08/14/2017    Assessment/Plan:  Infected cyst of skin  -area improving s/p I&D on 08/04/19 -healing nicely -discussed warm compresses and expressing any fluid/pressing on indurated skin -pruritis and sensitivity to skin likely  -will extend po abx x 7 more days for a total fo 14 days. -ok to take  a probiotic -given precautions - Plan: sulfamethoxazole-trimethoprim (BACTRIM DS) 800-160 MG tablet  F/u prn  Grier Mitts, MD

## 2019-08-08 NOTE — Patient Instructions (Signed)
Health Maintenance Due  Topic Date Due  . COVID-19 Vaccine (1) Never done    Depression screen [facility] 2/9 03/09/2019 12/02/2016  Decreased Interest 0 0  Down, Depressed, Hopeless 0 0  PHQ - 2 Score 0 0

## 2019-08-10 DIAGNOSIS — L720 Epidermal cyst: Secondary | ICD-10-CM | POA: Diagnosis not present

## 2019-08-17 ENCOUNTER — Other Ambulatory Visit: Payer: Self-pay

## 2019-08-17 ENCOUNTER — Ambulatory Visit: Payer: BC Managed Care – PPO | Admitting: Physician Assistant

## 2019-08-17 ENCOUNTER — Encounter: Payer: Self-pay | Admitting: Physician Assistant

## 2019-08-17 VITALS — BP 112/76 | HR 86 | Temp 97.7°F | Ht 68.0 in | Wt 180.8 lb

## 2019-08-17 DIAGNOSIS — R221 Localized swelling, mass and lump, neck: Secondary | ICD-10-CM

## 2019-08-17 DIAGNOSIS — E890 Postprocedural hypothyroidism: Secondary | ICD-10-CM | POA: Diagnosis not present

## 2019-08-17 LAB — CBC WITH DIFFERENTIAL/PLATELET
Basophils Absolute: 0 10*3/uL (ref 0.0–0.1)
Basophils Relative: 0.4 % (ref 0.0–3.0)
Eosinophils Absolute: 0.1 10*3/uL (ref 0.0–0.7)
Eosinophils Relative: 2.2 % (ref 0.0–5.0)
HCT: 36.5 % (ref 36.0–46.0)
Hemoglobin: 12.6 g/dL (ref 12.0–15.0)
Lymphocytes Relative: 19.7 % (ref 12.0–46.0)
Lymphs Abs: 1.2 10*3/uL (ref 0.7–4.0)
MCHC: 34.5 g/dL (ref 30.0–36.0)
MCV: 84.8 fl (ref 78.0–100.0)
Monocytes Absolute: 0.4 10*3/uL (ref 0.1–1.0)
Monocytes Relative: 7.1 % (ref 3.0–12.0)
Neutro Abs: 4.3 10*3/uL (ref 1.4–7.7)
Neutrophils Relative %: 70.6 % (ref 43.0–77.0)
Platelets: 263 10*3/uL (ref 150.0–400.0)
RBC: 4.3 Mil/uL (ref 3.87–5.11)
RDW: 13.3 % (ref 11.5–15.5)
WBC: 6 10*3/uL (ref 4.0–10.5)

## 2019-08-17 LAB — COMPREHENSIVE METABOLIC PANEL
ALT: 15 U/L (ref 0–35)
AST: 17 U/L (ref 0–37)
Albumin: 4.4 g/dL (ref 3.5–5.2)
Alkaline Phosphatase: 94 U/L (ref 39–117)
BUN: 14 mg/dL (ref 6–23)
CO2: 27 mEq/L (ref 19–32)
Calcium: 9.8 mg/dL (ref 8.4–10.5)
Chloride: 103 mEq/L (ref 96–112)
Creatinine, Ser: 0.81 mg/dL (ref 0.40–1.20)
GFR: 75.54 mL/min (ref >60.00)
Glucose, Bld: 91 mg/dL (ref 70–99)
Potassium: 3.9 mEq/L (ref 3.5–5.1)
Sodium: 139 mEq/L (ref 135–145)
Total Bilirubin: 0.4 mg/dL (ref 0.2–1.2)
Total Protein: 6.9 g/dL (ref 6.0–8.3)

## 2019-08-17 LAB — T4, FREE: Free T4: 0.94 ng/dL (ref 0.60–1.60)

## 2019-08-17 LAB — TSH: TSH: 0.23 u[IU]/mL — ABNORMAL LOW (ref 0.35–4.50)

## 2019-08-17 NOTE — Progress Notes (Signed)
Barbara Cardenas is a 49 y.o. female here for a new problem.  I acted as a Education administrator for [employer], PA-C [employer], [place]  History of Present Illness:   Chief Complaint  Patient presents with  . Adenopathy    HPI   Swollen lymph nodes Pt c/o swollen and tender lymph nodes on the right side of her neck. She has a hx of thyroid cancer. She noticed tenderness on the right side of her neck a few days ago. Denies unintentional weight changes, changes in temperature intolerance. Has normal fatigue for her. Last saw her endocrinologist in Feb 2021 and had a u/s that showed   Impression:  1. Unchanged hyperechoic soft tissue in medial right thyroid bed, possibly  residual thyroid tissue versus fibrosis.   Wt Readings from Last 5 Encounters:  08/17/19 180 lb 12.8 oz (82 kg)  08/08/19 180 lb (81.6 kg)  08/04/19 181 lb (82.1 kg)  05/26/19 175 lb 12.8 oz (79.7 kg)  05/24/19 176 lb (79.8 kg)   Currently takes 100 mcg levothyroxine regularly.  Past Medical History:  Diagnosis Date  . Anemia    during pregnancy  . Cancer of thyroid ([place]) 09/07/2015  . 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 GERD (gastroesophageal reflux disease)   . Headache   . Hypercholesteremia 12/21/2013  . Mediastinal lymphadenopathy   . Neuropathy   . Pneumonia   . Postoperative hypothyroidism 09/07/2015  . Seasonal allergies 07/20/2016  . Thyroid cancer ([place])   . Vision abnormalities      Social History   Tobacco Use  . Smoking status: Never Smoker  . Smokeless tobacco: Never Used  Vaping Use  . Vaping Use: Never used  Substance Use Topics  . Alcohol use: Yes    Comment: rare glass of wine  . Drug use: No    Past Surgical History:  Procedure Laterality Date  . DILATION AND CURETTAGE OF UTERUS    . THYROIDECTOMY, PARTIAL     full  . VIDEO BRONCHOSCOPY WITH ENDOBRONCHIAL ULTRASOUND N/A 09/08/2018   Procedure: VIDEO BRONCHOSCOPY WITH ENDOBRONCHIAL ULTRASOUND AND BIOPSIES;  Surgeon: Collene Gobble,  MD;  Location: [facility] OR;  Service: Thoracic;  Laterality: N/A;    Family History  Problem Relation Age of Onset  . Healthy Mother   . CAD Father   . Allergies Father   . Ulcerative colitis Father   . Tremor Father   . Neuropathy Neg Hx     No Known Allergies  Current Medications:   Current Outpatient Medications:  .  acetaminophen (TYLENOL) 500 MG tablet, Take 1,000 mg by mouth daily as needed for moderate pain or headache., Disp: , Rfl:  .  albuterol (PROAIR HFA) 108 (90 Base) MCG/ACT inhaler, Inhale 2 puffs into the lungs every 6 (six) hours as needed for wheezing or shortness of breath. , Disp: , Rfl:  .  ALPRAZolam (XANAX) 0.5 MG tablet, Take 1 tablet (0.5 mg total) by mouth as needed for anxiety. Reported on 04/02/2015, Disp: 30 tablet, Rfl: 0 .  diphenhydrAMINE (BENADRYL) 25 MG tablet, Take 25 mg by mouth daily as needed for allergies., Disp: , Rfl:  .  esomeprazole (NEXIUM) 20 MG capsule, Take 20 mg by mouth daily as needed (acid reflux)., Disp: , Rfl:  .  fexofenadine (ALLEGRA) 180 MG tablet, Take 180 mg by mouth every evening. , Disp: , Rfl:  .  ibuprofen (ADVIL) 200 MG tablet, Take 400 mg by mouth daily as needed for headache or moderate pain.,  Disp: , Rfl:  .  levothyroxine (SYNTHROID, LEVOTHROID) 100 MCG tablet, Take 100 mcg by mouth daily before breakfast. , Disp: , Rfl:  .  montelukast (SINGULAIR) 10 MG tablet, Take 10 mg by mouth every evening. , Disp: , Rfl:  .  Multiple Vitamin (MULTIVITAMIN WITH MINERALS) TABS tablet, Take 1 tablet by mouth 3 (three) times a week. , Disp: , Rfl:  .  Olopatadine HCl (PATADAY) 0.2 % SOLN, Place 1-2 drops into both eyes daily as needed (allergies). , Disp: , Rfl:  .  Olopatadine HCl 0.6 % SOLN, Place 2 sprays into both nostrils 2 (two) times daily., Disp: , Rfl:  .  sulfamethoxazole-trimethoprim (BACTRIM DS) 800-160 MG tablet, Take 1 tablet by mouth 2 (two) times daily for 7 days., Disp: 14 tablet, Rfl: 0 .  OVER THE COUNTER MEDICATION, Apply  1 application topically at bedtime. Topical vitamin c solution (Patient not taking: Reported on 08/17/2019), Disp: , Rfl:    Review of Systems:   ROS  Negative unless otherwise specified per HPI.  Vitals:   Vitals:   08/17/19 1315  BP: 112/76  Pulse: 86  Temp: 97.7 F (36.5 C)  TempSrc: Temporal  SpO2: 93%  Weight: 180 lb 12.8 oz (82 kg)  Height: 5\' 8"  (1.727 m)     Body mass index is 27.49 kg/m.  Physical Exam:   Physical Exam Vitals and nursing note reviewed.  Constitutional:      General: She is not in acute distress.    Appearance: She is well-developed. She is not ill-appearing or toxic-appearing.  HENT:     Right Ear: Tympanic membrane, ear canal and external ear normal.     Left Ear: Tympanic membrane, ear canal and external ear normal.  Neck:     Comments: Small fixed nodule palpated near R thyroid Cardiovascular:     Rate and Rhythm: Normal rate and regular rhythm.     Pulses: Normal pulses.     Heart sounds: Normal heart sounds, S1 normal and S2 normal.     Comments: No LE edema Pulmonary:     Effort: Pulmonary effort is normal.     Breath sounds: Normal breath sounds.  Skin:    General: Skin is warm and dry.  Neurological:     Mental Status: She is alert.     GCS: GCS eye subscore is 4. GCS verbal subscore is 5. GCS motor subscore is 6.  Psychiatric:        Speech: Speech normal.        Behavior: Behavior normal. Behavior is cooperative.      Assessment and Plan:   [name] was seen today for adenopathy.  Diagnoses and all orders for this visit:  Neck mass; Postoperative hypothyroidism Given hx of thyroid cancer, will update labs and order u/s. Will communicate with endocrinologist any relevant results. Follow-up based upon results. -     US Soft Tissue Head/Neck (NON-THYROID); Future -     CBC with Differential/Platelet -     TSH -     Comprehensive metabolic panel -     T4, free  . Reviewed expectations re: course of current medical  issues. . Discussed self-management of symptoms. . Outlined signs and symptoms indicating need for more acute intervention. . Patient verbalized understanding and all questions were answered. . See orders for this visit as documented in the electronic medical record. . Patient received an After-Visit Summary.  CMA or LPN served as scribe during this visit. History, Physical, and  Plan performed by medical provider. The above documentation has been reviewed and is accurate and complete.   [name], PA-C

## 2019-08-17 NOTE — Patient Instructions (Signed)
It was great to see you!  I will be in touch with your lab results.  We will contact you in a day or two about where to get your imaging.  Take care,  [name] PA-C

## 2019-08-24 ENCOUNTER — Ambulatory Visit
Admission: RE | Admit: 2019-08-24 | Discharge: 2019-08-24 | Disposition: A | Discharge status: Home / Self Care | Payer: BC Managed Care – PPO | Source: Ambulatory Visit | Attending: Physician Assistant | Admitting: Physician Assistant

## 2019-08-24 DIAGNOSIS — R221 Localized swelling, mass and lump, neck: Secondary | ICD-10-CM

## 2019-08-24 DIAGNOSIS — E0789 Other specified disorders of thyroid: Secondary | ICD-10-CM | POA: Diagnosis not present

## 2019-08-24 DIAGNOSIS — E890 Postprocedural hypothyroidism: Secondary | ICD-10-CM | POA: Diagnosis not present

## 2019-08-24 DIAGNOSIS — E041 Nontoxic single thyroid nodule: Secondary | ICD-10-CM | POA: Diagnosis not present

## 2019-09-07 DIAGNOSIS — H524 Presbyopia: Secondary | ICD-10-CM | POA: Diagnosis not present

## 2019-09-07 DIAGNOSIS — H40043 Steroid responder, bilateral: Secondary | ICD-10-CM | POA: Diagnosis not present

## 2019-09-12 DIAGNOSIS — Z03818 Encounter for observation for suspected exposure to other biological agents ruled out: Secondary | ICD-10-CM | POA: Diagnosis not present

## 2019-09-12 DIAGNOSIS — Z20822 Contact with and (suspected) exposure to covid-19: Secondary | ICD-10-CM | POA: Diagnosis not present

## 2019-09-22 ENCOUNTER — Ambulatory Visit: Payer: BC Managed Care – PPO | Attending: Internal Medicine

## 2019-09-22 ENCOUNTER — Ambulatory Visit: Payer: BC Managed Care – PPO

## 2019-10-20 ENCOUNTER — Telehealth: Payer: Self-pay

## 2019-10-20 DIAGNOSIS — Z0184 Encounter for antibody response examination: Secondary | ICD-10-CM

## 2019-10-20 NOTE — Telephone Encounter (Signed)
Pt is wanting a titer for Hep B. 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

## 2019-10-21 NOTE — Telephone Encounter (Signed)
Spoke to pt asked her if she had her Hepatitis B vaccines? Pt said yes. Told her okay I have placed the orders for Hepatitis B titer just need to schedule lab appt. Pt verbalized understanding. Appt scheduled for Monday.

## 2019-10-24 ENCOUNTER — Other Ambulatory Visit: Payer: Self-pay

## 2019-10-24 ENCOUNTER — Other Ambulatory Visit: Payer: BC Managed Care – PPO

## 2019-10-24 DIAGNOSIS — Z0184 Encounter for antibody response examination: Secondary | ICD-10-CM | POA: Diagnosis not present

## 2019-10-25 LAB — HEPATITIS B SURFACE ANTIBODY, QUANTITATIVE: Hep B S AB Quant (Post): 835 m[IU]/mL (ref >=10)

## 2019-11-07 ENCOUNTER — Other Ambulatory Visit: Payer: Self-pay

## 2019-11-07 ENCOUNTER — Other Ambulatory Visit: Payer: BC Managed Care – PPO

## 2019-11-07 ENCOUNTER — Telehealth: Payer: Self-pay

## 2019-11-07 DIAGNOSIS — E890 Postprocedural hypothyroidism: Secondary | ICD-10-CM

## 2019-11-07 NOTE — Telephone Encounter (Signed)
May order labs.  Free T4 and TSH  Dx: hypothyroidism, post-surgical

## 2019-11-07 NOTE — Telephone Encounter (Signed)
Please see message and advise what labs to order?

## 2019-11-07 NOTE — Telephone Encounter (Signed)
Spoke to pt told her we can do lab work here. Pt verbalized understanding. Appt scheduled for today. Orders in [place].

## 2019-11-07 NOTE — Telephone Encounter (Signed)
Pt is requesting bloodwork done for her thyroid function. Her Dr at [facility] is wanting it checked, but pt is asking if she can get it checked here so she doesn't have to drive to [place]. Pt is then wanting it sent to her Dr. At [facility]. Can we can do this?

## 2019-11-08 LAB — TSH: TSH: 0.84 mIU/L

## 2019-11-08 LAB — T4, FREE: Free T4: 1.3 ng/dL (ref 0.8–1.8)

## 2019-11-15 ENCOUNTER — Telehealth: Payer: Self-pay

## 2019-11-15 NOTE — Telephone Encounter (Signed)
Pt is requesting her labs be sent to her endocrinologist at [facility].

## 2019-11-17 NOTE — Telephone Encounter (Signed)
Left detailed message on personal voicemail calling in regards to your labs we are connected with [facility] so they should be able to see your labs. If you still need me to fax over need name of doctor and fax number.

## 2019-12-06 ENCOUNTER — Other Ambulatory Visit: Payer: Self-pay | Admitting: Obstetrics and Gynecology

## 2019-12-06 DIAGNOSIS — Z1231 Encounter for screening mammogram for malignant neoplasm of breast: Secondary | ICD-10-CM

## 2020-01-05 ENCOUNTER — Telehealth (INDEPENDENT_AMBULATORY_CARE_PROVIDER_SITE_OTHER): Payer: BC Managed Care – PPO | Admitting: Family Medicine

## 2020-01-05 ENCOUNTER — Encounter: Payer: Self-pay | Admitting: Family Medicine

## 2020-01-05 VITALS — Temp 98.7°F | Ht 68.0 in | Wt 179.0 lb

## 2020-01-05 DIAGNOSIS — J029 Acute pharyngitis, unspecified: Secondary | ICD-10-CM | POA: Diagnosis not present

## 2020-01-05 DIAGNOSIS — J4520 Mild intermittent asthma, uncomplicated: Secondary | ICD-10-CM

## 2020-01-05 MED ORDER — AMOXICILLIN 500 MG PO TABS: 500.0000 mg | ORAL_TABLET | Freq: Two times a day (BID) | ORAL | 0 refills | Status: AC

## 2020-01-05 MED ORDER — AZELASTINE HCL 0.1 % NA SOLN: 2.0000 | Freq: Two times a day (BID) | NASAL | 12 refills | Status: DC

## 2020-01-05 NOTE — Progress Notes (Signed)
   Barbara Cardenas is a 49 y.o. female who presents today for a virtual office visit.  Assessment/Plan:  Strep pharyngitis Rapid strep positive.  Will start amoxicillin.  Covid test also pending.  Will start Astelin nasal spray to help some with congestion.  She can continue using over-the-counter meds as needed for pain and fever.  Discussed reasons to return to care.  Follow-up as needed.    Subjective:  HPI:  Patient with sore throat and nasal congestion for 4 days. Son was sick with similar symptoms a week ago. Son was tested for covid which was negative. Daughter also sick with similar symptoms. Tried ibuprofen which did not help.   Tried mucinex which has helped.  No chest tightness or wheezing. More cough.        Objective/Observations  Physical Exam: Gen: NAD, resting comfortably CV: Regular rate and rhythm.  No murmurs noted Pulm: Normal work of breathing..  Clear to auscultation bilaterally. Neuro: Grossly normal, moves all extremities Psych: Normal affect and thought content  Virtual Visit via Video   I connected with [name] on 01/05/20 at 11:40 AM EST by a video enabled telemedicine application and verified that I am speaking with the correct person using two identifiers. The limitations of evaluation and management by telemedicine and the availability of in person appointments were discussed. The patient expressed understanding and agreed to proceed.   Patient location: Home Provider location: [place] participating in the virtual visit: Myself and Patient  At the conclusion of our virtual visit patient came in for testing.  I was able to perform physical exam at that point.     Algis Greenhouse. Jerline Pain, MD 01/05/2020 9:56 AM

## 2020-01-07 LAB — SARS-COV-2, NAA 2 DAY TAT

## 2020-01-07 LAB — NOVEL CORONAVIRUS, NAA: SARS-CoV-2, NAA: NOT DETECTED

## 2020-01-09 ENCOUNTER — Encounter: Payer: Self-pay | Admitting: Physician Assistant

## 2020-01-09 NOTE — Progress Notes (Signed)
Please inform patient of the following:  Covid test is negative.  Algis Greenhouse. Jerline Pain, MD 01/09/2020 8:02 AM

## 2020-01-10 ENCOUNTER — Other Ambulatory Visit: Payer: Self-pay

## 2020-01-10 ENCOUNTER — Ambulatory Visit
Admission: RE | Admit: 2020-01-10 | Discharge: 2020-01-10 | Disposition: A | Discharge status: Home / Self Care | Payer: BC Managed Care – PPO | Source: Ambulatory Visit | Attending: Obstetrics and Gynecology | Admitting: Obstetrics and Gynecology

## 2020-01-10 DIAGNOSIS — Z1231 Encounter for screening mammogram for malignant neoplasm of breast: Secondary | ICD-10-CM | POA: Diagnosis not present

## 2020-02-06 ENCOUNTER — Ambulatory Visit: Payer: BC Managed Care – PPO | Admitting: Physician Assistant

## 2020-02-07 ENCOUNTER — Other Ambulatory Visit: Payer: Self-pay

## 2020-02-07 ENCOUNTER — Encounter: Payer: Self-pay | Admitting: Physician Assistant

## 2020-02-07 ENCOUNTER — Ambulatory Visit (INDEPENDENT_AMBULATORY_CARE_PROVIDER_SITE_OTHER): Payer: BC Managed Care – PPO | Admitting: Physician Assistant

## 2020-02-07 VITALS — BP 126/80 | HR 74 | Temp 97.8°F | Ht 68.0 in | Wt 188.0 lb

## 2020-02-07 DIAGNOSIS — Z13220 Encounter for screening for lipoid disorders: Secondary | ICD-10-CM | POA: Diagnosis not present

## 2020-02-07 DIAGNOSIS — Z136 Encounter for screening for cardiovascular disorders: Secondary | ICD-10-CM

## 2020-02-07 DIAGNOSIS — R0789 Other chest pain: Secondary | ICD-10-CM | POA: Diagnosis not present

## 2020-02-07 NOTE — Progress Notes (Signed)
Barbara Cardenas is a 49 y.o. female is here to discuss: Acid reflex  I acted as a Education administrator for [employer], PA-C Anselmo Pickler, LPN   History of Present Illness:   Chief Complaint  Patient presents with  . Gastroesophageal Reflux    HPI   GERD Pt has been having increase in reflux for the past 2 weeks. Pt says she has been burping a lot. Has been taking Tums with relief. She felt like she had pizza and soda prior to onset of symptoms. Not uncommon for her to have discomfort in her chest with carbonated drinks.  Symptoms include epigastric pain, L sided chest pressure, L shoulder blade pain and L arm pain. Symptoms dull and mild. Resolved today. If she bends her arm a certain way above her head she can have return of symptoms in her chest wall. Bending over made symptoms worse. Did not have exacerbation of symptoms when doing physical activity.  Father has heart disease and MGF died of MI around 37.    Health Maintenance Due  Topic Date Due  . COVID-19 Vaccine (3 - Inadvertent risk 4-dose series) 07/12/2019    Past Medical History:  Diagnosis Date  . Anemia    during pregnancy  . Cancer of thyroid ([place]) 09/07/2015  . 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 GERD (gastroesophageal reflux disease)   . Headache   . Hypercholesteremia 12/21/2013  . Mediastinal lymphadenopathy   . Neuropathy   . Pneumonia   . Postoperative hypothyroidism 09/07/2015  . Seasonal allergies 07/20/2016  . Thyroid cancer ([place])   . Vision abnormalities      Social History   Tobacco Use  . Smoking status: Never Smoker  . Smokeless tobacco: Never Used  Vaping Use  . Vaping Use: Never used  Substance Use Topics  . Alcohol use: Yes    Comment: rare glass of wine  . Drug use: No    Past Surgical History:  Procedure Laterality Date  . DILATION AND CURETTAGE OF UTERUS    . THYROIDECTOMY, PARTIAL     full  . VIDEO BRONCHOSCOPY WITH ENDOBRONCHIAL ULTRASOUND N/A 09/08/2018   Procedure:  VIDEO BRONCHOSCOPY WITH ENDOBRONCHIAL ULTRASOUND AND BIOPSIES;  Surgeon: Collene Gobble, MD;  Location: [facility] OR;  Service: Thoracic;  Laterality: N/A;    Family History  Problem Relation Age of Onset  . Healthy Mother   . CAD Father   . Allergies Father   . Ulcerative colitis Father   . Tremor Father   . Neuropathy Neg Hx     PMHx, SurgHx, SocialHx, FamHx, Medications, and Allergies were reviewed in the Visit Navigator and updated as appropriate.   Patient Active Problem List   Diagnosis Date Noted  . Mediastinal lymphadenopathy 09/08/2018  . Abnormal CT of the chest 08/23/2018  . Allergic rhinitis due to animal (cat) (dog) hair and dander 03/08/2018  . GERD (gastroesophageal reflux disease) 03/08/2018  . Mild intermittent asthma 03/08/2018  . Other chronic allergic conjunctivitis 03/08/2018  . 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 Dysfunction of right eustachian tube 12/07/2016  . History of thyroid cancer 07/20/2016  . Seasonal allergies 07/20/2016  . 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 Postoperative hypothyroidism 09/07/2015  . Hypercholesteremia 12/21/2013    Social History   Tobacco Use  . Smoking status: Never Smoker  . Smokeless tobacco: Never Used  Vaping Use  . Vaping Use: Never used  Substance Use Topics  . Alcohol use: Yes    Comment: rare  glass of wine  . Drug use: No    Current Medications and Allergies:    Current Outpatient Medications:  .  acetaminophen (TYLENOL) 500 MG tablet, Take 1,000 mg by mouth daily as needed for moderate pain or headache., Disp: , Rfl:  .  albuterol (VENTOLIN HFA) 108 (90 Base) MCG/ACT inhaler, Inhale 2 puffs into the lungs every 6 (six) hours as needed for wheezing or shortness of breath. , Disp: , Rfl:  .  ALPRAZolam (XANAX) 0.5 MG tablet, Take 1 tablet (0.5 mg total) by mouth as needed for anxiety. Reported on 04/02/2015, Disp: 30 tablet, Rfl: 0 .  diphenhydrAMINE (BENADRYL) 25 MG tablet, Take 25 mg by  mouth daily as needed for allergies., Disp: , Rfl:  .  fexofenadine (ALLEGRA) 180 MG tablet, Take 180 mg by mouth every evening. , Disp: , Rfl:  .  ibuprofen (ADVIL) 200 MG tablet, Take 400 mg by mouth daily as needed for headache or moderate pain., Disp: , Rfl:  .  levothyroxine (SYNTHROID, LEVOTHROID) 100 MCG tablet, Take 100 mcg by mouth daily before breakfast. , Disp: , Rfl:  .  Multiple Vitamin (MULTIVITAMIN WITH MINERALS) TABS tablet, Take 1 tablet by mouth 3 (three) times a week. , Disp: , Rfl:  .  Olopatadine HCl 0.2 % SOLN, Place 1-2 drops into both eyes daily as needed (allergies). , Disp: , Rfl:  .  Olopatadine HCl 0.6 % SOLN, Place 2 sprays into both nostrils 2 (two) times daily., Disp: , Rfl:  .  OVER THE COUNTER MEDICATION, Take 2 tablets by mouth daily in the afternoon. Zinc, Magnesium and D3, Disp: , Rfl:    Allergies  Allergen Reactions  . Pseudoephedrine-Guaifenesin Er Other (See Comments)    Review of Systems   ROS Negative unless otherwise specified per HPI.  Vitals:   Vitals:   02/07/20 0838  BP: 126/80  Pulse: 74  Temp: 97.8 F (36.6 C)  TempSrc: Temporal  SpO2: 96%  Weight: 188 lb (85.3 kg)  Height: 5\' 8"  (1.727 m)     Body mass index is 28.59 kg/m.   Physical Exam:    Physical Exam Vitals and nursing note reviewed.  Constitutional:      General: She is not in acute distress.    Appearance: She is well-developed. She is not ill-appearing, toxic-appearing or sickly-appearing.  Cardiovascular:     Rate and Rhythm: Normal rate and regular rhythm.     Pulses: Normal pulses.     Heart sounds: Normal heart sounds, S1 normal and S2 normal.     Comments: No LE edema Pulmonary:     Effort: Pulmonary effort is normal.     Breath sounds: Normal breath sounds.  Skin:    General: Skin is warm, dry and intact.  Neurological:     Mental Status: She is alert.     GCS: GCS eye subscore is 4. GCS verbal subscore is 5. GCS motor subscore is 6.   Psychiatric:        Mood and Affect: Mood and affect normal.        Speech: Speech normal.        Behavior: Behavior normal. Behavior is cooperative.      Assessment and Plan:    Orlean was seen today for gastroesophageal reflux.  Diagnoses and all orders for this visit:  Chest discomfort EKG tracing is personally reviewed.  EKG notes NSR.  No acute changes.  No red flags on examination. Suspect possible GERD. Trial oral nexium  or prilosec. Worsening symptoms discussed, if symptoms worsen/persist, recommend close follow-up and likely cardiology referral. -     EKG 12-Lead -     CBC with Differential/Platelet; Future -     Comprehensive metabolic panel; Future  Encounter for lipid screening for cardiovascular disease -     Lipid panel; Future  CMA or LPN served as scribe during this visit. History, Physical, and Plan performed by medical provider. The above documentation has been reviewed and is accurate and complete.  [name], PA-C [place] [place] 02/07/2020  Follow-up: No follow-ups on file.

## 2020-02-07 NOTE — Patient Instructions (Signed)
It was great to see you!  We will update your blood work today.  EKG looks great.  Please start over the counter generic nexium or prilosec.  If symptoms persist after two weeks, give me a call and then we will have you see cardiology.  Have a great holiday!  Take care,  [name] PA-C

## 2020-02-08 LAB — COMPREHENSIVE METABOLIC PANEL
AG Ratio: 1.8 (calc) (ref 1.0–2.5)
ALT: 13 U/L (ref 6–29)
AST: 15 U/L (ref 10–35)
Albumin: 4.4 g/dL (ref 3.6–5.1)
Alkaline phosphatase (APISO): 94 U/L (ref 31–125)
BUN: 13 mg/dL (ref 7–25)
CO2: 27 mmol/L (ref 20–32)
Calcium: 9.3 mg/dL (ref 8.6–10.2)
Chloride: 106 mmol/L (ref 98–110)
Creat: 0.62 mg/dL (ref 0.50–1.10)
Globulin: 2.4 g/dL (calc) (ref 1.9–3.7)
Glucose, Bld: 73 mg/dL (ref 65–99)
Potassium: 4 mmol/L (ref 3.5–5.3)
Sodium: 141 mmol/L (ref 135–146)
Total Bilirubin: 0.5 mg/dL (ref 0.2–1.2)
Total Protein: 6.8 g/dL (ref 6.1–8.1)

## 2020-02-08 LAB — CBC WITH DIFFERENTIAL/PLATELET
Absolute Monocytes: 409 cells/uL (ref 200–950)
Basophils Absolute: 19 cells/uL (ref 0–200)
Basophils Relative: 0.3 %
Eosinophils Absolute: 143 cells/uL (ref 15–500)
Eosinophils Relative: 2.3 %
HCT: 38.5 % (ref 35.0–45.0)
Hemoglobin: 13 g/dL (ref 11.7–15.5)
Lymphs Abs: 1178 cells/uL (ref 850–3900)
MCH: 29.5 pg (ref 27.0–33.0)
MCHC: 33.8 g/dL (ref 32.0–36.0)
MCV: 87.3 fL (ref 80.0–100.0)
MPV: 9.6 fL (ref 7.5–12.5)
Monocytes Relative: 6.6 %
Neutro Abs: 4452 cells/uL (ref 1500–7800)
Neutrophils Relative %: 71.8 %
Platelets: 264 10*3/uL (ref 140–400)
RBC: 4.41 10*6/uL (ref 3.80–5.10)
RDW: 13.2 % (ref 11.0–15.0)
Total Lymphocyte: 19 %
WBC: 6.2 10*3/uL (ref 3.8–10.8)

## 2020-02-08 LAB — LIPID PANEL
Cholesterol: 244 mg/dL — ABNORMAL HIGH (ref <200)
HDL: 66 mg/dL (ref >=50)
LDL Cholesterol (Calc): 143 mg/dL (calc) — ABNORMAL HIGH
Non-HDL Cholesterol (Calc): 178 mg/dL (calc) — ABNORMAL HIGH (ref <130)
Total CHOL/HDL Ratio: 3.7 (calc) (ref <5.0)
Triglycerides: 209 mg/dL — ABNORMAL HIGH (ref <150)

## 2020-02-11 ENCOUNTER — Encounter: Payer: Self-pay | Admitting: Physician Assistant

## 2020-03-08 ENCOUNTER — Other Ambulatory Visit: Payer: Self-pay

## 2020-03-08 DIAGNOSIS — Z20822 Contact with and (suspected) exposure to covid-19: Secondary | ICD-10-CM

## 2020-03-10 LAB — SARS-COV-2, NAA 2 DAY TAT

## 2020-03-10 LAB — NOVEL CORONAVIRUS, NAA: SARS-CoV-2, NAA: DETECTED — AB

## 2020-03-12 ENCOUNTER — Telehealth: Payer: Self-pay

## 2020-03-12 NOTE — Telephone Encounter (Signed)
Called to discuss with patient about COVID-19 symptoms and the use of one of the available treatments for those with mild to moderate Covid symptoms and at a high risk of hospitalization.  Pt appears to qualify for outpatient treatment due to co-morbid conditions and/or a member of an at-risk group in accordance with the FDA Emergency Use Authorization.    Symptom onset: Unknown Vaccinated: Yes Booster? Unknown Immunocompromised? Unknown Qualifiers: Unknown  Unable to reach pt - Left message with call back number [phone].   Barbara Cardenas

## 2020-03-13 ENCOUNTER — Telehealth: Payer: Self-pay

## 2020-03-13 MED ORDER — LEVOTHYROXINE SODIUM 100 MCG PO TABS: 100.0000 ug | ORAL_TABLET | Freq: Every day | ORAL | 0 refills | Status: AC

## 2020-03-13 NOTE — Telephone Encounter (Signed)
Pt notified Rx sent to pharmacy

## 2020-03-13 NOTE — Telephone Encounter (Signed)
Pt called stating she had a virtual appt with her Endocrinologist at [facility] on Friday. Pt states she forgot to ask her provider to refill her levothyroxine. Pt states she has reached out to her provider about refilling it but he sometimes takes a while to get back to her. Pt asked if there was any way [facility] could send in a 90 day prescription of levothyroxine 100 mcg for her since she has not heard from her provider. Please advise.

## 2020-03-13 NOTE — Telephone Encounter (Signed)
OK with refill of 100 mcg levothyroxine

## 2020-03-13 NOTE — Telephone Encounter (Signed)
Please see message. °

## 2020-05-07 ENCOUNTER — Encounter: Payer: Self-pay | Admitting: Physician Assistant

## 2020-05-07 ENCOUNTER — Other Ambulatory Visit: Payer: Self-pay

## 2020-05-07 ENCOUNTER — Ambulatory Visit: Payer: 59 | Admitting: Physician Assistant

## 2020-05-07 VITALS — BP 130/80 | HR 71 | Temp 97.5°F | Ht 68.0 in | Wt 188.2 lb

## 2020-05-07 DIAGNOSIS — Z1211 Encounter for screening for malignant neoplasm of colon: Secondary | ICD-10-CM | POA: Diagnosis not present

## 2020-05-07 DIAGNOSIS — M7660 Achilles tendinitis, unspecified leg: Secondary | ICD-10-CM | POA: Diagnosis not present

## 2020-05-07 NOTE — Patient Instructions (Addendum)
It was great to see you!  Referral placed for colonoscopy -- they should contact you.  A referral has been placed for you to see Dr. Lynne Leader with [facility]. Someone from there office will be in touch soon regarding your appointment with him. His location: [place] at [facility] [address] on the 1st floor.   Phone number [phone], Fax [phone].  This location is across the street from the entrance to [employer] and in the same complex as the [facility] and [facility]   Voltaren Gel is available over the counter.  You are to apply this gel to your injured body part twice daily (morning and evening).   A little goes a long way so you can use about a pea-sized amount for each area.  ? Spread this small amount over the area into a thin film and let it dry.  ? Be sure that you do not rub the gel into your skin for more than 10 or 15 seconds otherwise it can irritate you skin.   ? Once you apply the gel, please do not put any other lotion or clothing in contact with that area for 30 minutes to allow the gel to absorb into your skin.   Some people are sensitive to the medication and can develop a sunburn-like rash.  If you have only mild symptoms it is okay to continue to use the medication but if you have any breakdown of your skin you should discontinue its use and please let us know.      Take care,  [name] PA-C

## 2020-05-07 NOTE — Progress Notes (Signed)
Barbara Cardenas is a 50 y.o. female here for a new problem.  I acted as a Education administrator for [employer], PA-C Anselmo Pickler, LPN   History of Present Illness:   Chief Complaint  Patient presents with  . Cyst    HPI   Achilles pain Pt c/o knot right on her heel, this started about 6 weeks ago. Pt is having pain and radiates into calf. Using Tylenol and Ibuprofen. She is unable to wear tennis shoes because the heel of the shoe bothers the area. Denies known trauma to the area.  Colonoscopy referral Due for screening colonoscopy. Denies: rectal bleeding, unintentional weight loss, unusual abdominal pain.   Past Medical History:  Diagnosis Date  . Anemia    during pregnancy  . Cancer of thyroid ([place]) 09/07/2015  . 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 GERD (gastroesophageal reflux disease)   . Headache   . Hypercholesteremia 12/21/2013  . Mediastinal lymphadenopathy   . Neuropathy   . Pneumonia   . Postoperative hypothyroidism 09/07/2015  . Seasonal allergies 07/20/2016  . Thyroid cancer ([place])   . Vision abnormalities      Social History   Tobacco Use  . Smoking status: Never Smoker  . Smokeless tobacco: Never Used  Vaping Use  . Vaping Use: Never used  Substance Use Topics  . Alcohol use: Yes    Comment: rare glass of wine  . Drug use: No    Past Surgical History:  Procedure Laterality Date  . DILATION AND CURETTAGE OF UTERUS    . THYROIDECTOMY, PARTIAL     full  . VIDEO BRONCHOSCOPY WITH ENDOBRONCHIAL ULTRASOUND N/A 09/08/2018   Procedure: VIDEO BRONCHOSCOPY WITH ENDOBRONCHIAL ULTRASOUND AND BIOPSIES;  Surgeon: Collene Gobble, MD;  Location: [facility] OR;  Service: Thoracic;  Laterality: N/A;    Family History  Problem Relation Age of Onset  . Healthy Mother   . CAD Father   . Allergies Father   . Ulcerative colitis Father   . Tremor Father   . Neuropathy Neg Hx     Allergies  Allergen Reactions  . Pseudoephedrine-Guaifenesin Er Other (See Comments)     Current Medications:   Current Outpatient Medications:  .  acetaminophen (TYLENOL) 500 MG tablet, Take 1,000 mg by mouth daily as needed for moderate pain or headache., Disp: , Rfl:  .  albuterol (VENTOLIN HFA) 108 (90 Base) MCG/ACT inhaler, Inhale 2 puffs into the lungs every 6 (six) hours as needed for wheezing or shortness of breath. , Disp: , Rfl:  .  ALPRAZolam (XANAX) 0.5 MG tablet, Take 1 tablet (0.5 mg total) by mouth as needed for anxiety. Reported on 04/02/2015, Disp: 30 tablet, Rfl: 0 .  diphenhydrAMINE (BENADRYL) 25 MG tablet, Take 25 mg by mouth daily as needed for allergies., Disp: , Rfl:  .  fexofenadine (ALLEGRA) 180 MG tablet, Take 180 mg by mouth every evening. , Disp: , Rfl:  .  ibuprofen (ADVIL) 200 MG tablet, Take 400 mg by mouth daily as needed for headache or moderate pain., Disp: , Rfl:  .  levothyroxine (SYNTHROID) 100 MCG tablet, Take 1 tablet (100 mcg total) by mouth daily before breakfast., Disp: 90 tablet, Rfl: 0 .  Multiple Vitamin (MULTIVITAMIN WITH MINERALS) TABS tablet, Take 1 tablet by mouth 3 (three) times a week. , Disp: , Rfl:  .  Olopatadine HCl 0.2 % SOLN, Place 1-2 drops into both eyes daily as needed (allergies). , Disp: , Rfl:  .  Olopatadine HCl 0.6 %  SOLN, Place 2 sprays into both nostrils 2 (two) times daily., Disp: , Rfl:  .  OVER THE COUNTER MEDICATION, Take 2 tablets by mouth daily in the afternoon. Zinc, Magnesium and D3, Disp: , Rfl:    Review of Systems:   ROS Negative unless otherwise specified per HPI.  Vitals:   Vitals:   05/07/20 0913  BP: 130/80  Pulse: 71  Temp: (!) 97.5 F (36.4 C)  TempSrc: Temporal  SpO2: 98%  Weight: 188 lb 4 oz (85.4 kg)  Height: 5\' 8"  (1.727 m)     Body mass index is 28.62 kg/m.  Physical Exam:   Physical Exam Constitutional:      Appearance: She is well-developed.  HENT:     Head: Normocephalic and atraumatic.  Eyes:     Conjunctiva/sclera: Conjunctivae normal.  Pulmonary:     Effort:  Pulmonary effort is normal.  Musculoskeletal:        General: Normal range of motion.     Cervical back: Normal range of motion and neck supple.     Comments: Palpable cyst to R achilles tendon; TTP  Skin:    General: Skin is warm and dry.  Neurological:     Mental Status: She is alert and oriented to person, place, and time.  Psychiatric:        Behavior: Behavior normal.        Thought Content: Thought content normal.        Judgment: Judgment normal.      Assessment and Plan:   Tais was seen today for cyst.  Diagnoses and all orders for this visit:  Achilles tendon pain Suspect possible ganglion cyst. Consider topical voltaren. Referral to sports medicine. -     Ambulatory referral to Sports Medicine  Special screening for malignant neoplasms, colon Referral to GI for screening colonoscopy. -     Ambulatory referral to Gastroenterology  CMA or LPN served as scribe during this visit. History, Physical, and Plan performed by medical provider. The above documentation has been reviewed and is accurate and complete.  [name], PA-C

## 2020-05-10 NOTE — Progress Notes (Signed)
Subjective:    I'm seeing this patient as a consultation for:  Len Blalock, [place]. Note will be routed back to referring provider/PCP.  CC: R Achille's pain  I, Molly Weber, LAT, ATC, am serving as scribe for Dr. Lynne Leader.  HPI: Pt is a 50 y/o female presenting w/ R Achille's pain x approximately 6 weeks w/ no known MOI.  She locates her pain to R Achille's tendon w/ nodule present. Pt reports pain when trying to wear sneakers to work out. Pt has a PMHx of plantar fascitis in R foot, that is mostly resolved.  Radiating pain: yes into her R calf and sometimes into arch Swelling: yes- w/ nodule Aggravating factors: pressure to the area, Treatments tried: Tylenol, IBU   Past medical history, Surgical history, Family history, Social history, Allergies, and medications have been entered into the medical record, reviewed.   Review of Systems: No new headache, visual changes, nausea, vomiting, diarrhea, constipation, dizziness, abdominal pain, skin rash, fevers, chills, night sweats, weight loss, swollen lymph nodes, body aches, joint swelling, muscle aches, chest pain, shortness of breath, mood changes, visual or auditory hallucinations.   Objective:    Vitals:   05/11/20 0928  BP: 110/78  Pulse: 73  SpO2: 98%   General: Well Developed, well nourished, and in no acute distress.  Neuro/Psych: Alert and oriented x3, extra-ocular muscles intact, able to move all 4 extremities, sensation grossly intact. Skin: Warm and dry, no rashes noted.  Respiratory: Not using accessory muscles, speaking in full sentences, trachea midline.  Cardiovascular: Pulses palpable, no extremity edema. Abdomen: Does not appear distended. MSK: Right Achilles visible nodule approximately 2 cm proximal to insertion at calcaneus. Normal ankle motion. Intact strength. Mildly tender palpation tendon nodule.  Otherwise nontender. Pulses cap refill and sensation intact distal foot.   Lab and Radiology  Results  Diagnostic Limited MSK Ultrasound of: Right Achilles tendon Achilles tendon is intact. Tendon nodule visible at distal Achilles tendon approximately 2 cm proximal to insertion site on the calcaneus.  At insertion of Achilles tendon measures just under 0.4 cm and at tendon nodule next month thickness is just under 0.7 cm. No visible tear present.  No significant increased Doppler activity present. Impression: Achilles tendon nodule   Impression and Recommendations:    Assessment and Plan: 50 y.o. female with right Achilles tendon nodule.  Discussed shoe fit.  Recommend against hard heel cups.  Recommend heel lift.  Recommend eccentric exercises.  Home exercise program taught in clinic today by me.  Additionally proceed with nitroglycerin patch protocol.  Recheck in 6 weeks.Marland Kitchen  PDMP not reviewed this encounter. Orders Placed This Encounter  Procedures  . [place] LIMITED JOINT SPACE STRUCTURES LOW RIGHT(NO LINKED CHARGES)    Standing Status:   Future    Number of Occurrences:   1    Standing Expiration Date:   11/11/2020    Order Specific Question:   Reason for Exam (SYMPTOM  OR DIAGNOSIS REQUIRED)    Answer:   right achilles pain    Order Specific Question:   Preferred imaging location?    Answer:   [facility]   Meds ordered this encounter  Medications  . nitroGLYCERIN (NITRODUR - DOSED IN MG/24 HR) 0.2 mg/hr patch    Sig: Apply 1/4 patch daily to tendon for tendonitis.    Dispense:  30 patch    Refill:  1    Discussed warning signs or symptoms. Please see discharge instructions. Patient expresses understanding.  The above documentation has been reviewed and is accurate and complete Lynne Leader, M.D.

## 2020-05-11 ENCOUNTER — Encounter: Payer: Self-pay | Admitting: Family Medicine

## 2020-05-11 ENCOUNTER — Ambulatory Visit: Payer: Self-pay

## 2020-05-11 ENCOUNTER — Other Ambulatory Visit: Payer: Self-pay

## 2020-05-11 ENCOUNTER — Ambulatory Visit (INDEPENDENT_AMBULATORY_CARE_PROVIDER_SITE_OTHER): Payer: 59 | Admitting: Family Medicine

## 2020-05-11 VITALS — BP 110/78 | HR 73 | Ht 68.0 in | Wt 186.4 lb

## 2020-05-11 DIAGNOSIS — S86001A Unspecified injury of right Achilles tendon, initial encounter: Secondary | ICD-10-CM

## 2020-05-11 MED ORDER — NITROGLYCERIN 0.2 MG/HR TD PT24: MEDICATED_PATCH | TRANSDERMAL | 1 refills | Status: DC

## 2020-05-11 NOTE — Patient Instructions (Addendum)
Thank you for coming in today.  I think you have an achillis tendon nodule.  Nitroglycerin Protocol   Apply 1/4 nitroglycerin patch to affected area daily.  Change position of patch within the affected area every 24 hours.  You may experience a headache during the first 1-2 weeks of using the patch, these should subside.  If you experience headaches after beginning nitroglycerin patch treatment, you may take your preferred over the counter pain reliever.  Another side effect of the nitroglycerin patch is skin irritation or rash related to patch adhesive.  Please notify our office if you develop more severe headaches or rash, and stop the patch.  Tendon healing with nitroglycerin patch may require 12 to 24 weeks depending on the extent of injury.  Men should not use if taking Viagra, Cialis, or Levitra.   Do not use if you have migraines or rosacea.   Do 30 reps 2-3x daily.   Use a bit of a heel lift.   Recheck in about 6 weeks,   Let me know if this is not working.   The is more to do.

## 2020-06-21 NOTE — Progress Notes (Signed)
   I, Wendy Poet, LAT, ATC, am serving as scribe for Dr. Lynne Leader.  Barbara Cardenas is a 50 y.o. female who presents to [facility] today for f/u of R Achille's tendinopathy.  She was last seen by Dr. Georgina Snell on 05/11/20 and was shown Alfredson's exercises and prescribed nitroglycerin patches.  Since her last visit, pt reports R Achille's is about the same. Pt has been noncompliant w/ HEP. Pt notes she may have gotten HA from nitro patches. Pt has been walking more than usual and has returned to working out at the gym. Pt notes increased pain when doing the ellipitcall.   Pertinent review of systems: No fevers or chills  Relevant historical information: Asthma   Exam:  BP 112/82 (BP Location: Right Arm, Patient Position: Sitting, Cuff Size: Normal)   Pulse 78   Ht 5\' 8"  (1.727 m)   Wt 189 lb (85.7 kg)   SpO2 98%   BMI 28.74 kg/m  General: Well Developed, well nourished, and in no acute distress.   MSK: Right Achilles tendon nodule present tender palpation mildly Normal foot and ankle motion and strength.     Assessment and Plan: 50 y.o. female with right Achilles tendon nodule minimally improved and stable with nitroglycerin patch protocol.  Patient has not been doing her home exercises very much.  Did some problem-solving and came up with a way for her to incorporate at least 1 episode of the eccentric exercises into her daily routine.  She will start trying to do the exercises prior to walking the dog which should be a way to do them regularly.  Exercises are the foundation to the treatment for this problem.  Continue nitroglycerin patch protocol and recheck in 2 months if all is well.  Additionally discussed some ways to continue other exercises as well including using the elliptical machine on a more flat setting and treadmill.    Discussed warning signs or symptoms. Please see discharge instructions. Patient expresses  understanding.   The above documentation has been reviewed and is accurate and complete Lynne Leader, M.D.  Total encounter time 20 minutes including face-to-face time with the patient and, reviewing past medical record, and charting on the date of service.   Treatment plan and options

## 2020-06-22 ENCOUNTER — Ambulatory Visit: Payer: 59 | Admitting: Family Medicine

## 2020-06-22 ENCOUNTER — Other Ambulatory Visit: Payer: Self-pay

## 2020-06-22 VITALS — BP 112/82 | HR 78 | Ht 68.0 in | Wt 189.0 lb

## 2020-06-22 DIAGNOSIS — M7661 Achilles tendinitis, right leg: Secondary | ICD-10-CM

## 2020-06-22 NOTE — Patient Instructions (Signed)
Thank you for coming in today.  Focus hard on trying to do the exercises at least once a day.  Remember to go from up to down slowly.  Add it before your exercise or after.  Recheck in about 2 months.   Let me know if you have a problem or need a refill.

## 2020-08-23 ENCOUNTER — Ambulatory Visit: Payer: 59 | Admitting: Family Medicine

## 2020-09-04 ENCOUNTER — Encounter: Payer: Self-pay | Admitting: Family Medicine

## 2020-09-04 ENCOUNTER — Ambulatory Visit: Payer: 59 | Admitting: Family Medicine

## 2020-09-04 ENCOUNTER — Other Ambulatory Visit: Payer: Self-pay

## 2020-09-04 VITALS — BP 114/74 | HR 92 | Temp 97.4°F | Wt 183.4 lb

## 2020-09-04 DIAGNOSIS — N951 Menopausal and female climacteric states: Secondary | ICD-10-CM

## 2020-09-04 DIAGNOSIS — R1032 Left lower quadrant pain: Secondary | ICD-10-CM | POA: Diagnosis not present

## 2020-09-04 DIAGNOSIS — F40243 Fear of flying: Secondary | ICD-10-CM

## 2020-09-04 DIAGNOSIS — M545 Low back pain, unspecified: Secondary | ICD-10-CM | POA: Diagnosis not present

## 2020-09-04 DIAGNOSIS — Z8049 Family history of malignant neoplasm of other genital organs: Secondary | ICD-10-CM

## 2020-09-04 MED ORDER — ALPRAZOLAM 0.5 MG PO TABS
0.5000 mg | ORAL_TABLET | ORAL | 0 refills | Status: DC | PRN
Start: 2020-09-04 — End: 2021-01-21

## 2020-09-04 MED ORDER — DICLOFENAC SODIUM 75 MG PO TBEC: 75.0000 mg | DELAYED_RELEASE_TABLET | Freq: Two times a day (BID) | ORAL | 0 refills | Status: DC

## 2020-09-04 NOTE — Patient Instructions (Signed)
Please follow up if symptoms do not improve or as needed.    Back Exercises The following exercises strengthen the muscles that help to support the trunk and back. They also help to keep the lower back flexible. Doing these exercises can help to prevent back pain or lessen existing pain. If you have back pain or discomfort, try doing these exercises 2-3 times each day or as told by your health care provider. As your pain improves, do them once each day, but increase the number of times that you repeat the steps for each exercise (do more repetitions). To prevent the recurrence of back pain, continue to do these exercises once each day or as told by your health care provider. Do exercises exactly as told by your health care provider and adjust them as directed. It is normal to feel mild stretching, pulling, tightness, or discomfort as you do these exercises, but you should stop right away if youfeel sudden pain or your pain gets worse. Exercises Single knee to chest Repeat these steps 3-5 times for each leg: Lie on your back on a firm bed or the floor with your legs extended. Bring one knee to your chest. Your other leg should stay extended and in contact with the floor. Hold your knee in place by grabbing your knee or thigh with both hands and hold. Pull on your knee until you feel a gentle stretch in your lower back or buttocks. Hold the stretch for 10-30 seconds. Slowly release and straighten your leg. Pelvic tilt Repeat these steps 5-10 times: Lie on your back on a firm bed or the floor with your legs extended. Bend your knees so they are pointing toward the ceiling and your feet are flat on the floor. Tighten your lower abdominal muscles to press your lower back against the floor. This motion will tilt your pelvis so your tailbone points up toward the ceiling instead of pointing to your feet or the floor. With gentle tension and even breathing, hold this position for 5-10  seconds. Cat-cow Repeat these steps until your lower back becomes more flexible: Get into a hands-and-knees position on a firm surface. Keep your hands under your shoulders, and keep your knees under your hips. You may place padding under your knees for comfort. Let your head hang down toward your chest. Contract your abdominal muscles and point your tailbone toward the floor so your lower back becomes rounded like the back of a cat. Hold this position for 5 seconds. Slowly lift your head, let your abdominal muscles relax and point your tailbone up toward the ceiling so your back forms a sagging arch like the back of a cow. Hold this position for 5 seconds.  Press-ups Repeat these steps 5-10 times: Lie on your abdomen (face-down) on the floor. Place your palms near your head, about shoulder-width apart. Keeping your back as relaxed as possible and keeping your hips on the floor, slowly straighten your arms to raise the top half of your body and lift your shoulders. Do not use your back muscles to raise your upper torso. You may adjust the placement of your hands to make yourself more comfortable. Hold this position for 5 seconds while you keep your back relaxed. Slowly return to lying flat on the floor.  Bridges Repeat these steps 10 times: Lie on your back on a firm surface. Bend your knees so they are pointing toward the ceiling and your feet are flat on the floor. Your arms should be flat  at your sides, next to your body. Tighten your buttocks muscles and lift your buttocks off the floor until your waist is at almost the same height as your knees. You should feel the muscles working in your buttocks and the back of your thighs. If you do not feel these muscles, slide your feet 1-2 inches farther away from your buttocks. Hold this position for 3-5 seconds. Slowly lower your hips to the starting position, and allow your buttocks muscles to relax completely. If this exercise is too easy, try  doing it with your arms crossed over yourchest. Abdominal crunches Repeat these steps 5-10 times: Lie on your back on a firm bed or the floor with your legs extended. Bend your knees so they are pointing toward the ceiling and your feet are flat on the floor. Cross your arms over your chest. Tip your chin slightly toward your chest without bending your neck. Tighten your abdominal muscles and slowly raise your trunk (torso) high enough to lift your shoulder blades a tiny bit off the floor. Avoid raising your torso higher than that because it can put too much stress on your low back and does not help to strengthen your abdominal muscles. Slowly return to your starting position. Back lifts Repeat these steps 5-10 times: Lie on your abdomen (face-down) with your arms at your sides, and rest your forehead on the floor. Tighten the muscles in your legs and your buttocks. Slowly lift your chest off the floor while you keep your hips pressed to the floor. Keep the back of your head in line with the curve in your back. Your eyes should be looking at the floor. Hold this position for 3-5 seconds. Slowly return to your starting position. Contact a health care provider if: Your back pain or discomfort gets much worse when you do an exercise. Your worsening back pain or discomfort does not lessen within 2 hours after you exercise. If you have any of these problems, stop doing these exercises right away. Do not do them again unless your health care provider says that you can. Get help right away if: You develop sudden, severe back pain. If this happens, stop doing the exercises right away. Do not do them again unless your health care provider says that you can. This information is not intended to replace advice given to you by your health care provider. Make sure you discuss any questions you have with your healthcare provider. Document Revised: 06/17/2018 Document Reviewed: 11/12/2017 Elsevier Patient  Education  [place].

## 2020-09-04 NOTE — Progress Notes (Signed)
Subjective  CC:  Chief Complaint  Patient presents with   Hip Pain    Left hip that radiates into her lower back, started about a month ago    Medication Refill    Requesting refill on Xanax    Same day acute visit; PCP not available. New pt to me. Chart reviewed.   HPI: Barbara Cardenas is a 50 y.o. female who presents to the office today to address the problems listed above in the chief complaint. 50 year old female complains any 2 to 3-day history of left lower abdominal pain that now radiates around to her back.  She does have pain with movement.  She denies injury.  No radicular symptoms.  No chronic low back pain.  Pain has been mild to moderate and almost daily.  She has taken intermittent Tylenol which does relieve the pain.  No weakness of the lower extremities.  No numbness or tingling.  Of note, last week she traveled by car.  On her way home she had some left lower quadrant abdominal pain that was associated with several bouts of watery diarrhea.  There is no blood or mucus in the stool.  Symptoms have completely resolved.  She denies fevers, chills or history of diverticulitis.  Besides the mild discomfort, she otherwise feels normal. Last menstrual period was in December 2021.  She believes she is perimenopausal.  However, at times she will have symptoms of uterine cramps.  She would like to know if this is normal. Her grandmother and her grandmother's cancer on her maternal side had female genital cancers, unknown type.  Both passed in her early 65s.  She would like to know if she should have a complete hysterectomy.  She does have follow-up with her gynecologist. 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  She uses it very rarely.  Last refill 2020.  Assessment  1. Acute left-sided low back pain without sciatica   2. Left lower quadrant pain   3. Fear of flying   4. Perimenopausal symptoms   5. Family history of cancer of genitourinary system      Plan   Left-sided low back pain and left lower quadrant pain: May be 2 separate issues.  Exam is consistent with musculoskeletal low back pain.  Trial of Voltaren for 2 weeks with food.  Recommend low back exercises and monitoring.  She will follow-up if it does not improve.  Left lower quadrant pain may be related to her back or possibly GI in nature.  She is not having any other GI symptoms at this time.  She will follow-up with me if things persist or worsen. Perimenopausal/postmenopausal: Education given.  Reassured Family history of genitourinary cancer.  Recommend trying to find out what type of cancer she has.  At this time there are no preventive recommendations for hysterectomy.  She will discuss further with her gynecologist. Fear of flying/anxiety: Xanax renewed. I reviewed patient's records from the PMP aware controlled substance registry today.   Follow up: as needed  Visit date not found  No orders of the defined types were placed in this encounter.  Meds ordered this encounter  Medications   diclofenac (VOLTAREN) 75 MG EC tablet    Sig: Take 1 tablet (75 mg total) by mouth 2 (two) times daily.    Dispense:  30 tablet    Refill:  0   ALPRAZolam (XANAX) 0.5 MG tablet    Sig: Take 1 tablet (0.5 mg total) by mouth  as needed (flying).    Dispense:  20 tablet    Refill:  0      I reviewed the patients updated PMH, FH, and SocHx.    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   albuterol (VENTOLIN HFA) 108 (90 Base) MCG/ACT inhaler Inhale 2 puffs into the lungs every 6 (six) hours as needed for wheezing or shortness of breath.    diclofenac (VOLTAREN) 75 MG EC tablet Take 1 tablet (75 mg total) by mouth 2 (two) times daily.   diphenhydrAMINE (BENADRYL) 25 MG tablet Take 25 mg by mouth daily as needed for allergies.   fexofenadine (ALLEGRA) 180 MG tablet Take 180 mg by mouth every evening.    ibuprofen (ADVIL) 200 MG tablet Take 400 mg by mouth daily as needed for headache or moderate pain.   levothyroxine (SYNTHROID) 100 MCG tablet Take 1 tablet (100 mcg total) by mouth daily before breakfast.   Multiple Vitamin (MULTIVITAMIN WITH MINERALS) TABS tablet Take 1 tablet by mouth 3 (three) times a week.    nitroGLYCERIN (NITRODUR - DOSED IN MG/24 HR) 0.2 mg/hr patch Apply 1/4 patch daily to tendon for tendonitis.   Olopatadine HCl 0.2 % SOLN Place 1-2 drops into both eyes daily as needed (allergies).    Olopatadine HCl 0.6 % SOLN Place 2 sprays into both nostrils 2 (two) times daily.   OVER THE COUNTER MEDICATION Take 2 tablets by mouth daily in the afternoon. Zinc, Magnesium and D3   [DISCONTINUED] ALPRAZolam (XANAX) 0.5 MG tablet Take 1 tablet (0.5 mg total) by mouth as needed for anxiety. Reported on 04/02/2015    Allergies: Patient is allergic to pseudoephedrine-guaifenesin er. Family History: Patient family history includes Allergies in her father; CAD in her father; Healthy in her mother; Tremor in her father; Ulcerative colitis in her father. Social History:  Patient  reports that she has never smoked. She has never used smokeless tobacco. She reports current alcohol use. She reports that she does not use drugs.  Review of  Systems: Constitutional: Negative for fever malaise or anorexia Cardiovascular: negative for chest pain Respiratory: negative for SOB or persistent cough   Objective  Vitals: BP 114/74   Pulse 92   Temp (!) 97.4 F (36.3 C) (Temporal)   Wt 183 lb 6.4 oz (83.2 kg)   SpO2 97%   BMI 27.89 kg/m  General: no acute distress , A&Ox3, appears well, moves well. HEENT: PEERL, conjunctiva normal, neck is supple Cardiovascular:  RRR without murmur or gallop.  Respiratory:  Good breath sounds bilaterally, CTAB with normal respiratory effort Gastrointestinal: soft, flat abdomen, normal active bowel sounds, no palpable masses, no hepatosplenomegaly, no appreciated hernias. Mild ttp llq w/o rebound or guarding Back: FROM, mild pain with lateral flexion and extension; + left paravertebral muscle ttp, no spinal  ttp. Neg seated SLR bilaterally. Nl DTRs bilateral LE. Nl strength. Nl gait Skin:  Warm, no rashes    Commons side effects, risks, benefits, and alternatives for medications and treatment plan prescribed today were discussed, and the patient expressed understanding of the given instructions. Patient is instructed to call or message via MyChart if he/she has any questions or concerns regarding our treatment plan. No barriers to understanding were identified. We discussed Red Flag symptoms and signs in detail. Patient expressed understanding regarding what to do in case of urgent or emergency type symptoms.  Medication list was reconciled, printed and provided to the patient in AVS. Patient instructions and summary information was reviewed with the patient as documented in the AVS. This note was prepared with assistance of Dragon voice recognition software. Occasional wrong-word or sound-a-like substitutions may have occurred due to the inherent limitations of voice recognition software  This visit occurred during the SARS-CoV-2 public health emergency.  Safety protocols were in place, including  screening questions prior to the visit, additional usage of staff PPE, and extensive cleaning of exam room while observing appropriate contact time as indicated for disinfecting solutions.

## 2020-09-26 ENCOUNTER — Ambulatory Visit: Payer: 59 | Admitting: Family Medicine

## 2020-09-27 ENCOUNTER — Ambulatory Visit: Payer: 59 | Admitting: Family Medicine

## 2020-10-16 NOTE — Progress Notes (Signed)
   I, Wendy Poet, LAT, ATC, am serving as scribe for Dr. Lynne Leader.  Barbara Cardenas is a 50 y.o. female who presents to [place] at [facility] today for f/u of R Achille's tendinopathy.  She was last seen by Dr. Georgina Snell on 06/22/20 and noted no change in her symptoms.  She was using the nitroglycerin patches but was not doing her HEP.  She was advised to begin the Alfredson's exercises as previously instructed.  Today, pt reports that she doesn't notice much difference in terms of her R Achille's swelling.  She notices some decrease in her pain.  She has been doing her Alfredson's exercises intermittently and con't to wear her nitroglycerin patches but not as consistently as beforehand.   Overall she notes that her Achilles tendon nodule is not as painful as it has been by a significant margin but it still present and annoying to a degree.  She notes is not bad enough that she would want to have surgery at this time.  Pertinent review of systems: No fevers or chills  Relevant historical information: History of undifferentiated connective tissue disease   Exam:  BP 110/82 (BP Location: Left Arm, Patient Position: Sitting, Cuff Size: Normal)   Pulse 79   Ht '5\' 8"'$  (1.727 m)   Wt 185 lb 9.6 oz (84.2 kg)   SpO2 97%   BMI 28.22 kg/m  General: Well Developed, well nourished, and in no acute distress.   MSK: Right Achilles tendon with visible and palpable nodule about 2 cm proximal to insertion onto calcaneus.  Nontender normal ankle motion and strength.    Lab and Radiology Results  Diagnostic Limited MSK Ultrasound of: Right Achilles tendon Visible Achilles tendon nodule/increased diameter about 2 cm proximal to insertion with mild increased hypoechoic change within tendon without visible tear or increased vascular activity. Impression: Achilles tendon nodule.      Assessment and Plan: 50 y.o. female with right Achilles tendon nodule.  Pain has  improved but the nodule still present.  Plan to continue nitroglycerin patch protocol and home exercise program.  We will plan for watchful waiting at this point.  If having setbacks next step would probably be MRI for potential injection planning for PRP or surgical planning.  Her symptoms right now are not bad enough to warrant it.   PDMP not reviewed this encounter. Orders Placed This Encounter  Procedures   [place] LIMITED JOINT SPACE STRUCTURES LOW RIGHT(NO LINKED CHARGES)    Order Specific Question:   Reason for Exam (SYMPTOM  OR DIAGNOSIS REQUIRED)    Answer:   eval AT    Order Specific Question:   Preferred imaging location?    Answer:   [place]   Meds ordered this encounter  Medications   nitroGLYCERIN (NITRODUR - DOSED IN MG/24 HR) 0.2 mg/hr patch    Sig: Apply 1/4 patch daily to tendon for tendonitis.    Dispense:  30 patch    Refill:  1     Discussed warning signs or symptoms. Please see discharge instructions. Patient expresses understanding.   The above documentation has been reviewed and is accurate and complete Lynne Leader, M.D.

## 2020-10-17 ENCOUNTER — Ambulatory Visit: Payer: Self-pay

## 2020-10-17 ENCOUNTER — Other Ambulatory Visit: Payer: Self-pay

## 2020-10-17 ENCOUNTER — Encounter: Payer: Self-pay | Admitting: Family Medicine

## 2020-10-17 ENCOUNTER — Ambulatory Visit: Payer: 59 | Admitting: Family Medicine

## 2020-10-17 VITALS — BP 110/82 | HR 79 | Ht 68.0 in | Wt 185.6 lb

## 2020-10-17 DIAGNOSIS — M7661 Achilles tendinitis, right leg: Secondary | ICD-10-CM

## 2020-10-17 MED ORDER — NITROGLYCERIN 0.2 MG/HR TD PT24: MEDICATED_PATCH | TRANSDERMAL | 1 refills | Status: DC

## 2020-10-17 NOTE — Patient Instructions (Signed)
Thank you for coming in today.   Continue home exercises.  This will likely slowly improve over time.   Next step is MRI and possibly surgery or PRP injection.

## 2020-11-08 ENCOUNTER — Other Ambulatory Visit: Payer: Self-pay | Admitting: Family Medicine

## 2020-11-28 ENCOUNTER — Other Ambulatory Visit: Payer: Self-pay | Admitting: Obstetrics and Gynecology

## 2020-11-28 DIAGNOSIS — Z1231 Encounter for screening mammogram for malignant neoplasm of breast: Secondary | ICD-10-CM

## 2020-11-29 ENCOUNTER — Other Ambulatory Visit: Payer: Self-pay

## 2020-11-29 ENCOUNTER — Ambulatory Visit (INDEPENDENT_AMBULATORY_CARE_PROVIDER_SITE_OTHER): Payer: 59

## 2020-11-29 DIAGNOSIS — Z23 Encounter for immunization: Secondary | ICD-10-CM

## 2021-01-10 ENCOUNTER — Other Ambulatory Visit: Payer: Self-pay | Admitting: Family Medicine

## 2021-01-10 ENCOUNTER — Other Ambulatory Visit: Payer: Self-pay

## 2021-01-10 ENCOUNTER — Ambulatory Visit
Admission: RE | Admit: 2021-01-10 | Discharge: 2021-01-10 | Disposition: A | Discharge status: Home / Self Care | Payer: 59 | Source: Ambulatory Visit | Attending: Obstetrics and Gynecology | Admitting: Obstetrics and Gynecology

## 2021-01-10 DIAGNOSIS — Z1231 Encounter for screening mammogram for malignant neoplasm of breast: Secondary | ICD-10-CM

## 2021-01-19 ENCOUNTER — Other Ambulatory Visit: Payer: Self-pay | Admitting: Family Medicine

## 2021-01-19 DIAGNOSIS — F40243 Fear of flying: Secondary | ICD-10-CM

## 2021-01-21 NOTE — Telephone Encounter (Signed)
Sam's patient. thanks

## 2021-02-07 ENCOUNTER — Telehealth: Payer: Self-pay

## 2021-02-07 NOTE — Telephone Encounter (Signed)
Patient called today and she has fallen and hurt her knee-feel three steps down left knee hurts pretty bad-she has iced it and taken ibuprofen but it is still very sore - I have sent this patient to the triage nurse team

## 2021-02-11 ENCOUNTER — Encounter: Payer: Self-pay | Admitting: Physician Assistant

## 2021-02-11 ENCOUNTER — Ambulatory Visit: Payer: 59 | Admitting: Physician Assistant

## 2021-02-11 ENCOUNTER — Other Ambulatory Visit: Payer: Self-pay

## 2021-02-11 VITALS — BP 120/80 | HR 79 | Temp 97.7°F | Ht 68.0 in | Wt 184.5 lb

## 2021-02-11 DIAGNOSIS — M25562 Pain in left knee: Secondary | ICD-10-CM

## 2021-02-11 NOTE — Patient Instructions (Signed)
It was great to see you!  Use the ACE bandage to keep your knee wrapped  May take ibuprofen after you are done with prednisone -- if you'd like meloxicam or other prescribed anti-inflammatory I can send that in, but sports medicine will likely offer something of that nature  You will get a call from doctor Corey's office  Take care,  [name] PA-C

## 2021-02-11 NOTE — Progress Notes (Signed)
Barbara Cardenas is a 50 y.o. female here for knee pain.  History of Present Illness:   Chief Complaint  Patient presents with   Knee Pain    Pt fell and bruised left knee on 12/8, having intense pain per pt.    HPI  Knee Pain Barbara Cardenas experienced a fall on 01/31/21, which resulted in a contusion forming on her left knee. States she was walking up some stairs and did land on her right arm and shoulder. Following the fall, she was in great pain and had decreased ROM. Initially she did ice it twice and was taking ibuprofen as an effort to manage her pain which provided temporary relief. Upon falling ill around the time of her fall with nasal congestion, cough, and sinus pressure, she visited the UC and received a prednisone 10 mg taper x 6 days.   At this time Barbara Cardenas is aware that her ability to walk up the stairs and increased ROM is due to her current use of prednisone 10 mg taper.  She is worried that upon stopping use of prednisone, she will be in great pain.   Past Medical History:  Diagnosis Date   Anemia    during pregnancy   Cancer of thyroid ([place]) 09/07/2015   Fear of flying 07/20/2016   GERD (gastroesophageal reflux disease)    Headache    Hypercholesteremia 12/21/2013   Mediastinal lymphadenopathy    Neuropathy    Pneumonia    Postoperative hypothyroidism 09/07/2015   Seasonal allergies 07/20/2016   Thyroid cancer ([place])    Vision abnormalities      Social History   Tobacco Use   Smoking status: Never   Smokeless tobacco: Never  Vaping Use   Vaping Use: Never used  Substance Use Topics   Alcohol use: Yes    Comment: rare glass of wine   Drug use: No    Past Surgical History:  Procedure Laterality Date   DILATION AND CURETTAGE OF UTERUS     THYROIDECTOMY, PARTIAL     full   VIDEO BRONCHOSCOPY WITH ENDOBRONCHIAL ULTRASOUND N/A 09/08/2018   Procedure: VIDEO BRONCHOSCOPY WITH ENDOBRONCHIAL ULTRASOUND AND BIOPSIES;  Surgeon: Collene Gobble, MD;   Location: [facility] OR;  Service: Thoracic;  Laterality: N/A;    Family History  Problem Relation Age of Onset   Healthy Mother    CAD Father    Allergies Father    Ulcerative colitis Father    Tremor Father    Neuropathy Neg Hx     Allergies  Allergen Reactions   Pseudoephedrine-Guaifenesin Er Other (See Comments)    Current Medications:   Current Outpatient Medications:    acetaminophen (TYLENOL) 500 MG tablet, Take 1,000 mg by mouth daily as needed for moderate pain or headache., Disp: , Rfl:    albuterol (VENTOLIN HFA) 108 (90 Base) MCG/ACT inhaler, Inhale 2 puffs into the lungs every 6 (six) hours as needed for wheezing or shortness of breath. , Disp: , Rfl:    ALPRAZolam (XANAX) 0.5 MG tablet, TAKE 1 TABLET (0.5 MG TOTAL) BY MOUTH AS NEEDED (FLYING)., Disp: 20 tablet, Rfl: 0   diphenhydrAMINE (BENADRYL) 25 MG tablet, Take 25 mg by mouth daily as needed for allergies., Disp: , Rfl:    fexofenadine (ALLEGRA) 180 MG tablet, Take 180 mg by mouth every evening. , Disp: , Rfl:    ibuprofen (ADVIL) 200 MG tablet, Take 400 mg by mouth daily as needed for headache or moderate pain., Disp: , Rfl:  levothyroxine (SYNTHROID) 100 MCG tablet, Take 1 tablet (100 mcg total) by mouth daily before breakfast., Disp: 90 tablet, Rfl: 0   Multiple Vitamin (MULTIVITAMIN WITH MINERALS) TABS tablet, Take 1 tablet by mouth 3 (three) times a week. , Disp: , Rfl:    nitroGLYCERIN (NITRODUR - DOSED IN MG/24 HR) 0.2 mg/hr patch, APPLY 1/4 PATCH DAILY TO TENDON FOR TENDONITIS., Disp: 30 patch, Rfl: 1   Olopatadine HCl 0.2 % SOLN, Place 1-2 drops into both eyes daily as needed (allergies). , Disp: , Rfl:    Olopatadine HCl 0.6 % SOLN, Place 2 sprays into both nostrils 2 (two) times daily., Disp: , Rfl:    OVER THE COUNTER MEDICATION, Take 2 tablets by mouth daily in the afternoon. Zinc, Magnesium and D3, Disp: , Rfl:    predniSONE (STERAPRED UNI-PAK 21 TAB) 10 MG (21) TBPK tablet, Take by mouth as directed., Disp:  , Rfl:    Review of Systems:   ROS Negative unless otherwise specified per HPI. Vitals:   Vitals:   02/11/21 0944  BP: 120/80  Pulse: 79  Temp: 97.7 F (36.5 C)  TempSrc: Temporal  SpO2: 97%  Weight: 184 lb 8 oz (83.7 kg)  Height: 5\' 8"  (1.727 m)     Body mass index is 28.05 kg/m.  Physical Exam:   Physical Exam Vitals and nursing note reviewed.  Constitutional:      General: She is not in acute distress.    Appearance: She is well-developed. She is not ill-appearing or toxic-appearing.  Cardiovascular:     Rate and Rhythm: Normal rate and regular rhythm.     Pulses: Normal pulses.     Heart sounds: Normal heart sounds, S1 normal and S2 normal.  Pulmonary:     Effort: Pulmonary effort is normal.     Breath sounds: Normal breath sounds.  Musculoskeletal:     Left knee: Swelling present. Decreased range of motion.     Comments: Left medial knee swelling and tenderness Slight ecchymosis No obvious joint line tenderness No calf tenderness/erythema  Skin:    General: Skin is warm and dry.  Neurological:     Mental Status: She is alert.     GCS: GCS eye subscore is 4. GCS verbal subscore is 5. GCS motor subscore is 6.  Psychiatric:        Speech: Speech normal.        Behavior: Behavior normal. Behavior is cooperative.    Assessment and Plan:   Acute pain of left knee Uncontrolled Recommended use of ACE bandage compression-- provided in office today  May use ibuprofen as need following completion of prednisone Informed patient that if desired, a prescription for meloxicam or other anti-inflammatory can be sent in for pain management Referral to sports medicine for possibility of ultrasound of further work-up as determined by specialist  Barbara Cardenas,acting as a scribe for Barbara Coke, PA.,have documented all relevant documentation on the behalf of Barbara Coke, PA,as directed by  Barbara Coke, PA while in the presence of Barbara Cardenas,  [place].  I, Barbara Cardenas, [place], have reviewed all documentation for this visit. The documentation on 02/11/21 for the exam, diagnosis, procedures, and orders are all accurate and complete.   Barbara Coke, PA-C

## 2021-02-12 NOTE — Progress Notes (Signed)
I, Peterson Lombard, LAT, ATC acting as a scribe for Barbara Leader, MD.  Barbara Cardenas is a 50 y.o. female who presents to [place] at [facility] today for L knee pain. Pt was previously seen by Dr. Georgina Snell on 10/17/20 for R Achilles tendonopathy. Pt is currently on her last 1 day of a prednisone dose pack due to an URI. Today, pt c/o L knee pain ongoing since 12/8 after suffering a fall down the stairs x3, landing on her R side. Pt was trying to turn around and go back down the stairs. Pt locates pain to anterior-medial aspect of the L knee.   L knee swelling: yes- w/ bruising Mechanical symptoms: Yes, positive clicking and popping. Aggravates: TTP, rotational motions, at night, stiffness after being a flexed position Treatments tried: ice, IBU, prednisone  Pertinent review of systems: No fevers or chills  Relevant historical information: History of thyroid cancer   Exam:  BP 128/84    Pulse 90    Ht 5\' 8"  (1.727 m)    Wt 184 lb (83.5 kg)    LMP 02/01/2020    SpO2 97%    BMI 27.98 kg/m  General: Well Developed, well nourished, and in no acute distress.   MSK: Left knee mild bruising anterior medial knee.  Normal knee motion.  Tender palpation anterior medial knee.  Stable ligamentous exam.  Positive medial McMurray's test.  Intact strength.    Lab and Radiology Results Procedure: Real-time Ultrasound Guided Injection of left knee superior lateral patellar space Device: Philips Affiniti 50G Images permanently stored and available for review in PACS Verbal informed consent obtained.  Discussed risks and benefits of procedure. Warned about infection bleeding damage to structures skin hypopigmentation and fat atrophy among others. Patient expresses understanding and agreement Time-out conducted.   Noted no overlying erythema, induration, or other signs of local infection.   Skin prepped in a sterile fashion.   Local anesthesia: Topical Ethyl chloride.    With sterile technique and under real time ultrasound guidance: 40 mg of Kenalog and 2 mL of Marcaine injected into knee joint. Fluid seen entering the joint capsule.   Completed without difficulty   Pain immediately resolved suggesting accurate placement of the medication.   Advised to call if fevers/chills, erythema, induration, drainage, or persistent bleeding.   Images permanently stored and available for review in the ultrasound unit.  Impression: Technically successful ultrasound guided injection.   X-ray images left knee obtained today personally and independently interpreted Mild medial compartment DJD.  No acute fractures. Await formal radiology review     Assessment and Plan: 50 y.o. female with left knee pain thought to be due to either contusion from the fall or possibly a medial meniscus tear from a rotational injury leading up to the fall.  Discussed options.  Plan for steroid injection today.  Additionally continue conservative management options of Voltaren gel, oral NSAIDs, and quad strengthening exercises.  If not improving patient will notify me and we can proceed to MRI for potential surgical planning.   PDMP not reviewed this encounter. Orders Placed This Encounter  Procedures   [place] LIMITED JOINT SPACE STRUCTURES LOW LEFT(NO LINKED CHARGES)    Standing Status:   Future    Number of Occurrences:   1    Standing Expiration Date:   08/13/2021    Order Specific Question:   Reason for Exam (SYMPTOM  OR DIAGNOSIS REQUIRED)    Answer:   left knee pain  Order Specific Question:   Preferred imaging location?    Answer:   [place]   DG Knee AP/LAT W/[place] Left    Standing Status:   Future    Number of Occurrences:   1    Standing Expiration Date:   02/12/2022    Order Specific Question:   Reason for Exam (SYMPTOM  OR DIAGNOSIS REQUIRED)    Answer:   left knee pain    Order Specific Question:   Preferred imaging location?    Answer:    [facility]    Order Specific Question:   Is patient pregnant?    Answer:   No   No orders of the defined types were placed in this encounter.    Discussed warning signs or symptoms. Please see discharge instructions. Patient expresses understanding.   The above documentation has been reviewed and is accurate and complete Barbara Cardenas, M.D.

## 2021-02-13 ENCOUNTER — Other Ambulatory Visit: Payer: Self-pay

## 2021-02-13 ENCOUNTER — Ambulatory Visit: Payer: 59 | Admitting: Family Medicine

## 2021-02-13 ENCOUNTER — Ambulatory Visit (INDEPENDENT_AMBULATORY_CARE_PROVIDER_SITE_OTHER): Payer: 59

## 2021-02-13 ENCOUNTER — Ambulatory Visit: Payer: Self-pay

## 2021-02-13 ENCOUNTER — Ambulatory Visit: Payer: 59

## 2021-02-13 VITALS — BP 128/84 | HR 90 | Ht 68.0 in | Wt 184.0 lb

## 2021-02-13 DIAGNOSIS — M25562 Pain in left knee: Secondary | ICD-10-CM | POA: Diagnosis not present

## 2021-02-13 NOTE — Patient Instructions (Addendum)
Thank you for coming in today.  Call or go to the ER if you develop a large red swollen joint with extreme pain or oozing puss.  Please get an Xray today before you leave  Follow up as needed if not better we can get an MRI

## 2021-02-14 NOTE — Progress Notes (Signed)
Left knee x-ray shows minimal arthritis changes of the knee.

## 2021-03-01 ENCOUNTER — Encounter: Payer: Self-pay | Admitting: Physician Assistant

## 2021-03-01 ENCOUNTER — Other Ambulatory Visit: Payer: Self-pay

## 2021-03-01 ENCOUNTER — Ambulatory Visit: Payer: 59 | Admitting: Physician Assistant

## 2021-03-01 VITALS — BP 102/64 | HR 67 | Temp 97.8°F | Ht 68.0 in | Wt 182.5 lb

## 2021-03-01 DIAGNOSIS — F419 Anxiety disorder, unspecified: Secondary | ICD-10-CM

## 2021-03-01 DIAGNOSIS — G4700 Insomnia, unspecified: Secondary | ICD-10-CM

## 2021-03-01 DIAGNOSIS — K219 Gastro-esophageal reflux disease without esophagitis: Secondary | ICD-10-CM

## 2021-03-01 DIAGNOSIS — N950 Postmenopausal bleeding: Secondary | ICD-10-CM | POA: Diagnosis not present

## 2021-03-01 DIAGNOSIS — R253 Fasciculation: Secondary | ICD-10-CM

## 2021-03-01 MED ORDER — TRAZODONE HCL 50 MG PO TABS: 25.0000 mg | ORAL_TABLET | Freq: Every evening | ORAL | 3 refills | Status: DC | PRN

## 2021-03-01 MED ORDER — ALPRAZOLAM 0.5 MG PO TABS: 0.5000 mg | ORAL_TABLET | ORAL | 0 refills | Status: DC | PRN

## 2021-03-01 NOTE — Progress Notes (Signed)
Barbara Cardenas is a 51 y.o. female here for left eye twitching.  History of Present Illness:   Chief Complaint  Patient presents with   Eye Problem    Pt c/o left eye twitching for the past off and on for a long time. Has gotten worse this past week.   Gastroesophageal Reflux    Pt has been c/o increase in indigestion, burping since November.   Insomnia    Pt would like to discuss sleep aids.    HPI  Left Eye Twitching Barbara Cardenas presents with concerns due to intermittent left eye twitching. Pt reports that this issue has been going on for a while, but has gotten worse this past week. Upon further discussion Barbara Cardenas does believe that an increase in stress and lack of sleep could be the cause for this. Denies changes in caffeine intake, changes in vision, headaches, weakness, or dizziness.    Insomnia Barbara Cardenas states Barbara Cardenas would like to trial a medication for sleep due to ongoing insomnia. Barbara Cardenas has found that Barbara Cardenas has had trouble when it comes to falling asleep. Barbara Cardenas has used tylenol PM and Motrin PM, which Barbara Cardenas has found beneficial but Barbara Cardenas doesn't believe it to be okay to take the excessively.   GERD Additionally, pt has noticed an increase in indigestion sx, such as burping, since this past November. In an attempt to manage her sx, Barbara Cardenas has adjusted her diet, mainly consuming high fiber foods, but was provided with no relief. Pt does reports that Barbara Cardenas has a hx of acid reflux, which makes her believe the issue has returned.   Anxiety Currently Barbara Cardenas is compliant with taking xanax 0.5 mg as needed with no adverse effects. Barbara Cardenas is managing well at this time, but will need a refill of this medication.   Post Menopausal Bleeding Pt reports that Barbara Cardenas had her last menstrual cycle in December of 2021 and thought herself to be going through menopause. It wasn't until a year later in December of 2022 that Barbara Cardenas experienced a normal menstrual cycle. Pt states that prior to recent menstrual cycle,  Barbara Cardenas felt that Barbara Cardenas was going to have a cycle for months due to sweet cravings and feeling pre-menstrual cramps, but never did.   At this time Barbara Cardenas shows concern that something bigger could be occurring due to fhx of what Barbara Cardenas believes to be cervical cancer related. Pt is planning to f/u with gynecology soon for further evaluation.    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   GERD (gastroesophageal reflux disease)    Headache    Hypercholesteremia 12/21/2013   Mediastinal lymphadenopathy    Neuropathy    Pneumonia    Postoperative hypothyroidism 09/07/2015   Seasonal allergies 07/20/2016   Thyroid cancer ([place])    Vision abnormalities      Social History   Tobacco Use   Smoking status: Never   Smokeless tobacco: Never  Vaping Use   Vaping Use: Never used  Substance Use Topics   Alcohol use: Yes    Comment: rare glass of wine   Drug use: No    Past Surgical History:  Procedure Laterality Date   DILATION AND CURETTAGE OF UTERUS     THYROIDECTOMY, PARTIAL     full   VIDEO BRONCHOSCOPY WITH ENDOBRONCHIAL ULTRASOUND N/A 09/08/2018   Procedure: VIDEO BRONCHOSCOPY WITH ENDOBRONCHIAL ULTRASOUND AND BIOPSIES;  Surgeon: Baltazar Apo  S, MD;  Location: [facility] OR;  Service: Thoracic;  Laterality: N/A;    Family History  Problem Relation Age of Onset   Healthy Mother    CAD Father    Allergies Father    Ulcerative colitis Father    Tremor Father    Neuropathy Neg Hx     Allergies  Allergen Reactions   Pseudoephedrine-Guaifenesin Er Other (See Comments)    Current Medications:   Current Outpatient Medications:    acetaminophen (TYLENOL) 500 MG tablet, Take 1,000 mg by mouth daily as needed for moderate pain or headache., Disp: , Rfl:    albuterol (VENTOLIN HFA) 108 (90 Base) MCG/ACT inhaler, Inhale 2 puffs into the lungs every 6 (six) hours as needed for wheezing or shortness of breath. ,  Disp: , Rfl:    ALPRAZolam (XANAX) 0.5 MG tablet, TAKE 1 TABLET (0.5 MG TOTAL) BY MOUTH AS NEEDED (FLYING)., Disp: 20 tablet, Rfl: 0   diphenhydrAMINE (BENADRYL) 25 MG tablet, Take 25 mg by mouth daily as needed for allergies., Disp: , Rfl:    fexofenadine (ALLEGRA) 180 MG tablet, Take 180 mg by mouth every evening. , Disp: , Rfl:    ibuprofen (ADVIL) 200 MG tablet, Take 400 mg by mouth daily as needed for headache or moderate pain., Disp: , Rfl:    levothyroxine (SYNTHROID) 100 MCG tablet, Take 1 tablet (100 mcg total) by mouth daily before breakfast., Disp: 90 tablet, Rfl: 0   Multiple Vitamin (MULTIVITAMIN WITH MINERALS) TABS tablet, Take 1 tablet by mouth 3 (three) times a week. , Disp: , Rfl:    nitroGLYCERIN (NITRODUR - DOSED IN MG/24 HR) 0.2 mg/hr patch, APPLY 1/4 PATCH DAILY TO TENDON FOR TENDONITIS., Disp: 30 patch, Rfl: 1   Olopatadine HCl 0.2 % SOLN, Place 1-2 drops into both eyes daily as needed (allergies). , Disp: , Rfl:    Olopatadine HCl 0.6 % SOLN, Place 2 sprays into both nostrils 2 (two) times daily., Disp: , Rfl:    OVER THE COUNTER MEDICATION, Take 2 tablets by mouth daily in the afternoon. Zinc, Magnesium and D3, Disp: , Rfl:    Review of Systems:    ROS Negative unless otherwise specified per HPI.   Vitals:   Vitals:   03/01/21 1302  BP: 102/64  Pulse: 67  Temp: 97.8 F (36.6 C)  TempSrc: Temporal  SpO2: 97%  Weight: 182 lb 8 oz (82.8 kg)  Height: 5\' 8"  (1.727 m)     Body mass index is 27.75 kg/m.  Physical Exam:   Physical Exam Vitals and nursing note reviewed.  Constitutional:      General: Barbara Cardenas is not in acute distress.    Appearance: Barbara Cardenas is well-developed. Barbara Cardenas is not ill-appearing or toxic-appearing.  Cardiovascular:     Rate and Rhythm: Normal rate and regular rhythm.     Pulses: Normal pulses.     Heart sounds: Normal heart sounds, S1 normal and S2 normal.  Pulmonary:     Effort: Pulmonary effort is normal.     Breath sounds: Normal breath  sounds.  Skin:    General: Skin is warm and dry.  Neurological:     General: No focal deficit present.     Mental Status: Barbara Cardenas is alert.     GCS: GCS eye subscore is 4. GCS verbal subscore is 5. GCS motor subscore is 6.     Cranial Nerves: Cranial nerves 2-12 are intact.     Sensory: Sensation is intact.     Motor:  Motor function is intact.     Coordination: Coordination is intact.  Psychiatric:        Speech: Speech normal.        Behavior: Behavior normal. Behavior is cooperative.    Assessment and Plan:   Insomnia, unspecified type; Left Eye Twitching Start trazodone 25-50 mg daily as needed for sleep Advised patient to work on stress management to improve symptoms Follow up if new/worsening symptoms occurs -- consider neuro referral if eye twitching persists  Gastroesophageal reflux disease, unspecified whether esophagitis present Trial OTC antacid such as pepcid, nexium, or zantac x two weeks  Follow up if new/worsening symptoms occurs or lack of improvement  Anxiety Controlled  No red flags on exam; Denies SI/HI Continue Xanax 0.5 mg daily as needed Follow-up as needed  Post-menopausal bleeding Follow up with gynecology for further evaluation -- patient states Barbara Cardenas has appointment next week to discuss this   I,Havlyn C Ratchford,acting as a scribe for [employer], PA.,have documented all relevant documentation on the behalf of [name], PA,as directed by  [name], PA while in the presence of [name], [place].  I, [name], [place], have reviewed all documentation for this visit. The documentation on 03/01/21 for the exam, diagnosis, procedures, and orders are all accurate and complete.   [name], PA-C

## 2021-03-01 NOTE — Patient Instructions (Signed)
It was great to see you!  Please get the COVID booster  For your eye twitching: -If you develop new symptoms, let me know -Work on stress management  For your burping - Trial an over the counter antacid for two weeks to see if this helps  For your sleep -Trial 25 - 50 mg trazodone nightly - If not helpful, let me know  Take care,  [name] PA-C

## 2021-03-13 ENCOUNTER — Other Ambulatory Visit: Payer: Self-pay

## 2021-03-22 ENCOUNTER — Encounter: Payer: Self-pay | Admitting: Family Medicine

## 2021-03-22 ENCOUNTER — Ambulatory Visit: Payer: 59 | Admitting: Family Medicine

## 2021-03-22 ENCOUNTER — Other Ambulatory Visit: Payer: Self-pay

## 2021-03-22 VITALS — BP 130/90 | HR 78 | Temp 98.0°F | Ht 68.0 in | Wt 185.0 lb

## 2021-03-22 DIAGNOSIS — N6082 Other benign mammary dysplasias of left breast: Secondary | ICD-10-CM | POA: Diagnosis not present

## 2021-03-22 MED ORDER — DOXYCYCLINE HYCLATE 100 MG PO TABS: 100.0000 mg | ORAL_TABLET | Freq: Two times a day (BID) | ORAL | 0 refills | Status: DC

## 2021-03-22 NOTE — Progress Notes (Signed)
Subjective:     Patient ID: Barbara Cardenas, female    DOB: Mar 26, 1970, 51 y.o.   MRN: [id number]  Chief Complaint  Patient presents with   Cyst    Cyst on left breast, scheduled to have removed in March Big, red and painful, more firm than before Noticed change in cyst on yesterday     HPI Cyst L breast.  Sch for removal in March.  Now, since yesterday, red and painful and more firm.  Had in 2021 and "removed" in office but very painful.  Had appt in Fall to remove from Derm but cancelled and now  Sch for March.    Had Massage few days ago but not touched there.  No f/c.  Lump doubled in size.  No d/c.   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   GERD (gastroesophageal reflux disease)    Headache    Hypercholesteremia 12/21/2013   Mediastinal lymphadenopathy    Neuropathy    Pneumonia    Postoperative hypothyroidism 09/07/2015   Seasonal allergies 07/20/2016   Thyroid cancer ([place])    Vision abnormalities     Past Surgical History:  Procedure Laterality Date   DILATION AND CURETTAGE OF UTERUS     THYROIDECTOMY, PARTIAL     full   VIDEO BRONCHOSCOPY WITH ENDOBRONCHIAL ULTRASOUND N/A 09/08/2018   Procedure: VIDEO BRONCHOSCOPY WITH ENDOBRONCHIAL ULTRASOUND AND BIOPSIES;  Surgeon: Collene Gobble, MD;  Location: [facility] OR;  Service: Thoracic;  Laterality: N/A;    Outpatient Medications Prior to Visit  Medication Sig Dispense Refill   acetaminophen (TYLENOL) 500 MG tablet Take 1,000 mg by mouth daily as needed for moderate pain or headache.     albuterol (VENTOLIN HFA) 108 (90 Base) MCG/ACT inhaler Inhale 2 puffs into the lungs every 6 (six) hours as needed for wheezing or shortness of breath.      ALPRAZolam (XANAX) 0.5 MG  tablet Take 1 tablet (0.5 mg total) by mouth as needed (flying). 20 tablet 0   diphenhydrAMINE (BENADRYL) 25 MG tablet Take 25 mg by mouth daily as needed for allergies.     fexofenadine (ALLEGRA) 180 MG tablet Take 180 mg by mouth every evening.      ibuprofen (ADVIL) 200 MG tablet Take 400 mg by mouth daily as needed for headache or moderate pain.     levothyroxine (SYNTHROID) 100 MCG tablet Take 1 tablet (100 mcg total) by mouth daily before breakfast. 90 tablet 0   Multiple Vitamin (MULTIVITAMIN WITH MINERALS) TABS tablet Take 1 tablet by mouth 3 (three) times a week.      nitroGLYCERIN (NITRODUR - DOSED IN MG/24 HR) 0.2 mg/hr patch APPLY 1/4 PATCH DAILY TO TENDON FOR TENDONITIS. 30 patch 1   Olopatadine HCl 0.2 % SOLN Place 1-2 drops into both eyes daily as needed (allergies).      Olopatadine HCl 0.6 % SOLN Place 2 sprays into both nostrils 2 (two) times daily.     OVER THE COUNTER MEDICATION Take 2 tablets by mouth daily in the afternoon. Zinc, Magnesium and D3     traZODone (DESYREL) 50 MG tablet Take 0.5-1 tablets (25-50 mg total) by mouth at bedtime as needed for sleep. 30 tablet 3  No facility-administered medications prior to visit.    Allergies  Allergen Reactions   Pseudoephedrine-Guaifenesin Er Other (See Comments)   OVA:NVBTYOMA/[id number] except as noted in HPI      Objective:     BP 130/90    Pulse 78    Temp 98 F (36.7 C) (Temporal)    Ht 5\' 8"  (1.727 m)    Wt 185 lb (83.9 kg)    SpO2 98%    BMI 28.13 kg/m  Wt Readings from Last 3 Encounters:  03/22/21 185 lb (83.9 kg)  03/01/21 182 lb 8 oz (82.8 kg)  02/13/21 184 lb (83.5 kg)        Gen: WDWN NAD HEENT: NCAT, conjunctiva not injected, sclera nonicteric EXT:  no edema MSK: no gross abnormalities.  NEURO: A&O x3.  CN II-XII intact.  PSYCH: normal mood. Good eye contact No axillary nodes on L L medial breast-approx 1cm, firm, red, tender nodule with surrounding erythema for about 2cm.  Tried to  aspirate-only blood(more organized)  Assessment & Plan:   Problem List Items Addressed This Visit   None Visit Diagnoses     Sebaceous cyst of breast, left    -  Primary      Infected sebaceous cyst L breast-doxy, soaks.  Worse, may need further I&D(nothing came out today) so ER/UC.  O/w, has appt in March for removal at derm.  Meds ordered this encounter  Medications   doxycycline (VIBRA-TABS) 100 MG tablet    Sig: Take 1 tablet (100 mg total) by mouth 2 (two) times daily.    Dispense:  20 tablet    Refill:  0    [place], MD

## 2021-03-22 NOTE — Patient Instructions (Signed)
It was very nice to see you today!  Soaks few times/day.  Take doxycycline.  Motrin/tylenol.   If worse to urgent care/ER   PExercise at least 150 minutes every week.

## 2021-03-28 ENCOUNTER — Other Ambulatory Visit: Payer: Self-pay | Admitting: Physician Assistant

## 2021-04-13 ENCOUNTER — Other Ambulatory Visit: Payer: Self-pay | Admitting: Family Medicine

## 2021-05-03 ENCOUNTER — Telehealth: Payer: Self-pay | Admitting: Physician Assistant

## 2021-05-03 NOTE — Telephone Encounter (Signed)
noted 

## 2021-05-03 NOTE — Telephone Encounter (Signed)
Pt is wanting to try different medication to help her sleep. I advised pt she would need to schedule an appt. She stated she would call back to schedule. ?

## 2021-05-21 ENCOUNTER — Telehealth (INDEPENDENT_AMBULATORY_CARE_PROVIDER_SITE_OTHER): Payer: 59 | Admitting: Physician Assistant

## 2021-05-21 ENCOUNTER — Other Ambulatory Visit: Payer: Self-pay

## 2021-05-21 ENCOUNTER — Encounter: Payer: Self-pay | Admitting: Physician Assistant

## 2021-05-21 DIAGNOSIS — F419 Anxiety disorder, unspecified: Secondary | ICD-10-CM

## 2021-05-21 MED ORDER — ALPRAZOLAM 0.5 MG PO TABS: 0.5000 mg | ORAL_TABLET | ORAL | 0 refills | Status: DC | PRN

## 2021-05-21 MED ORDER — HYDROXYZINE HCL 25 MG PO TABS: ORAL_TABLET | ORAL | 1 refills | Status: DC

## 2021-05-21 NOTE — Progress Notes (Signed)
? ?Virtual Visit via Video Note  ? ?IInda Cardenas, Barbara with  Elisse Cardenas  ([id number], [birth year]) on 05/21/21 at  3:40 PM EDT by a video-enabled telemedicine application and verified that I am speaking with the correct person using two identifiers. ? ?Location: ?Patient: Home ?Provider: [place] office ?  ?I discussed the limitations of evaluation and management by telemedicine and the availability of in person appointments. The patient expressed understanding and agreed to proceed.   ? ?History of Present Illness: ?Barbara Cardenas is a 51 y.o. who identifies as a female who was assigned female at birth, and is being seen today for insomnia. ? ?She was last seen by me in Jan for insomnia. We started trazodone. She took trazodone only for two nights and felt like it kept her awake. She is now working full time and did not feel like continuing to try the trazodone as she was adjusting to her new job.  She started taking Xanax that she had on hand to see if this would help her sleep and it did.  She is wondering if there is a better alternative for her sleep. ? ? ?Problems:  ?Patient Active Problem List  ? Diagnosis Date Noted  ? Mediastinal lymphadenopathy 09/08/2018  ? Abnormal CT of the chest 08/23/2018  ? Allergic rhinitis due to animal (cat) (dog) hair and dander 03/08/2018  ? GERD (gastroesophageal reflux disease) 03/08/2018  ? Mild intermittent asthma 03/08/2018  ? Other chronic allergic conjunctivitis 03/08/2018  ? 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 Dysfunction of right eustachian tube 12/07/2016  ? History of thyroid cancer 07/20/2016  ? Seasonal allergies 07/20/2016  ? 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 Postoperative hypothyroidism 09/07/2015  ? Hypercholesteremia 12/21/2013  ?  ?Allergies:  ?Allergies  ?Allergen Reactions  ? Pseudoephedrine-Guaifenesin Er Other (See Comments)  ? ?Medications:  ?Current Outpatient Medications:  ?   acetaminophen (TYLENOL) 500 MG tablet, Take 1,000 mg by mouth daily as needed for moderate pain or headache., Disp: , Rfl:  ?  albuterol (VENTOLIN HFA) 108 (90 Base) MCG/ACT inhaler, Inhale 2 puffs into the lungs every 6 (six) hours as needed for wheezing or shortness of breath. , Disp: , Rfl:  ?  diphenhydrAMINE (BENADRYL) 25 MG tablet, Take 25 mg by mouth daily as needed for allergies., Disp: , Rfl:  ?  hydrOXYzine (ATARAX) 25 MG tablet, Take 0.5-1 tablet nightly as needed for sleep., Disp: 30 tablet, Rfl: 1 ?  ibuprofen (ADVIL) 200 MG tablet, Take 400 mg by mouth daily as needed for headache or moderate pain., Disp: , Rfl:  ?  levothyroxine (SYNTHROID) 100 MCG tablet, Take 1 tablet (100 mcg total) by mouth daily before breakfast., Disp: 90 tablet, Rfl: 0 ?  Multiple Vitamin (MULTIVITAMIN WITH MINERALS) TABS tablet, Take 1 tablet by mouth 3 (three) times a week. , Disp: , Rfl:  ?  nitroGLYCERIN (NITRODUR - DOSED IN MG/24 HR) 0.2 mg/hr patch, APPLY 1/4 PATCH DAILY TO TENDON FOR TENDONITIS., Disp: 30 patch, Rfl: 1 ?  Olopatadine HCl 0.2 % SOLN, Place 1-2 drops into both eyes daily as needed (allergies). , Disp: , Rfl:  ?  Olopatadine HCl 0.6 % SOLN, Place 2 sprays into both nostrils 2 (two) times daily., Disp: , Rfl:  ?  OVER THE COUNTER MEDICATION, Take 2 tablets by mouth daily in the afternoon. Zinc, Magnesium and D3, Disp: , Rfl:  ?  traZODone (DESYREL) 50 MG tablet, TAKE 0.5-1 TABLETS BY MOUTH AT  BEDTIME AS NEEDED FOR SLEEP., Disp: 90 tablet, Rfl: 0 ?  ALPRAZolam (XANAX) 0.5 MG tablet, Take 1 tablet (0.5 mg total) by mouth as needed (flying)., Disp: 30 tablet, Rfl: 0 ?  doxycycline (VIBRA-TABS) 100 MG tablet, Take 1 tablet (100 mg total) by mouth 2 (two) times daily. (Patient not taking: Reported on 05/21/2021), Disp: 20 tablet, Rfl: 0 ?  fexofenadine (ALLEGRA) 180 MG tablet, Take 180 mg by mouth every evening.  (Patient not taking: Reported on 05/21/2021), Disp: , Rfl:  ? ?Observations/Objective: ?Patient is  well-developed, well-nourished in no acute distress.  ?Resting comfortably at home.  ?Head is normocephalic, atraumatic.  ?No labored breathing.  ?Speech is clear and coherent with logical content.  ?Patient is alert and oriented at baseline.  ? ? ?Assessment and Plan: ?1. Anxiety ?- ALPRAZolam (XANAX) 0.5 MG tablet; Take 1 tablet (0.5 mg total) by mouth as needed (flying).  Dispense: 30 tablet; Refill: 0 ?Uncontrolled ?Recommend stopping trazodone and trialing hydroxyzine 25 mg daily.  Recommend cutting tablet in half and taking 12.5 mg at night, with permission to increase to 25 mg daily as needed. ?I also did refill her Xanax and she is aware that this is not a long-term solution for insomnia. ?We also briefly discussed gabapentin and may consider trialing this if hydroxyzine is not helpful for her. ?Follow-up as needed. ? ? ?Follow Up Instructions: ?I discussed the assessment and treatment plan with the patient. The patient was provided an opportunity to ask questions and all were answered. The patient agreed with the plan and demonstrated an understanding of the instructions.  A copy of instructions were sent to the patient via MyChart unless otherwise noted below.  ? ?The patient was advised to call back or seek an in-person evaluation if the symptoms worsen or if the condition fails to improve as anticipated. ? ?Barbara Coke, PA ?

## 2021-06-03 ENCOUNTER — Telehealth: Payer: Self-pay

## 2021-06-03 NOTE — Telephone Encounter (Signed)
Caller states she has pain in eyes felt like needles, ?red, itchy ?Translation No ?Nurse Assessment ?Nurse: Anabel Bene, RN, [place] Date/Time Eilene Ghazi Time): 06/01/2021 7:58:08 PM ?Confirm and document reason for call. If ?symptomatic, describe symptoms. ?---Caller states pts eyes started bothering her last night ?- thought it was just allergies. Tonight they started ?itchy, pins and needles, watery. States it feels like ?when she had pink eye. No fever. No other symptoms. ?Has used her allergy eye drops. No contacts. ?Does the patient have any new or worsening ?symptoms? ---Yes ?Will a triage be completed? ---Yes ?Related visit to physician within the last 2 weeks? ---No ?Does the PT have any chronic conditions? (i.e. ?diabetes, asthma, this includes High risk factors for ?pregnancy, etc.) ?---No ?Is the patient pregnant or possibly pregnant? (Ask ?all females between the ages of 70-55) ---No ?Is this a behavioral health or substance abuse call? ---No ?Guidelines ?Guideline Title Affirmed Question Affirmed Notes Nurse Date/Time (Eastern ?Time) ?Eye Pain and Other ?Symptoms ?[1] Foreign body ?sensation ("feels ?like something is ?in there") AND [2] ?irrigation didn't help ?Anabel Bene, RN, ?Anderson [place] ?06/01/2021 8:00:56 PM ?PLEASE NOTE: All timestamps contained within this report are represented as [place] Standard Time. ?CONFIDENTIALTY NOTICE: This fax transmission is intended only for the addressee. It contains information that is legally privileged, confidential or ?otherwise protected from use or disclosure. If you are not the intended recipient, you are strictly prohibited from reviewing, disclosing, copying using ?or disseminating any of this information or taking any action in reliance on or regarding this information. If you have received this fax in error, please ?notify us immediately by telephone so that we can arrange for its return to [place]. Phone: [phone], Toll-Free: [phone], Fax: [phone] ?Page: 2  of 2 ?Call Id: [id number] ?Disp. Time (Eastern ?Time) Disposition Final User ?06/01/2021 8:09:23 PM Go to ED Now Yes Anabel Bene, RN, Anderson [place] ?Caller Disagree/Comply Disagree ?Caller Understands Yes ?PreDisposition InappropriateToAsk ?Care Advice Given Per Guideline ?GO TO ED NOW: * You need to be seen in the Emergency Department. ANOTHER ADULT SHOULD DRIVE: * It is better and ?safer if another adult drives instead of you. REDUCING THE PAIN: CARE ADVICE given per Eye Pain and Other Symptoms ?(Adult) guideline. ?Comments ?User: Rosanna Randy, RN Date/Time Eilene Ghazi Time): 06/01/2021 8:08:53 PM ?Pt wanted a medication called in. Per directives there is no standing orders for pink eye and directives say do not ?call for meds after-hours. Pt upset and says she will call the office on Monday about this. Declines going to the ED ?- states she is not spending $1k on pink eye and there are no open urgent cares. ?Referrals ?GO TO FACILITY REFUSE ?

## 2021-06-03 NOTE — Telephone Encounter (Signed)
FYI, see Triage note. Pt was seen yesterday at Urgent care. ?

## 2021-06-05 ENCOUNTER — Telehealth: Payer: Self-pay | Admitting: Physician Assistant

## 2021-06-05 NOTE — Telephone Encounter (Signed)
Spoke to pt told her will not be able to send any medication since she has not seen you, will have to wait till tomorrow. Pt verbalized understanding. ?

## 2021-06-05 NOTE — Telephone Encounter (Signed)
Pt states her pink eye is not getting any better. She is asking if something else can be called in. I have scheduled an appt for her on 4/13. Please advise ?

## 2021-06-06 ENCOUNTER — Ambulatory Visit (INDEPENDENT_AMBULATORY_CARE_PROVIDER_SITE_OTHER): Payer: 59 | Admitting: Physician Assistant

## 2021-06-06 VITALS — BP 132/88 | HR 79 | Temp 98.0°F | Ht 68.0 in | Wt 188.4 lb

## 2021-06-06 DIAGNOSIS — H1031 Unspecified acute conjunctivitis, right eye: Secondary | ICD-10-CM | POA: Diagnosis not present

## 2021-06-06 DIAGNOSIS — R253 Fasciculation: Secondary | ICD-10-CM | POA: Diagnosis not present

## 2021-06-06 NOTE — Progress Notes (Signed)
Barbara Cardenas is a 51 y.o. female here for a follow up of a pre-existing problem. ? ?SCRIBE STATEMENT ? ?History of Present Illness:  ? ?Chief Complaint  ?Patient presents with  ? Conjunctivitis  ?  Pt c/o conjunctivitis symptoms; Saturday eyes were bothering her, feeling like sand in eye, Urgent care dx her with bacterial infection but meds not working. Eyes are matted shut in the morning when waking up and it's yellow, today seems some better but both eyes are still itching in corners, and left eye twitching  ? ? ?HPI ? ?Conjunctivitis ?Was seen at urgent care on 06/02/21 -- woke up that morning with matted eyes and was given polytrim. She used those and felt like they did not help. ? ?She then went to an urgent care in [place] a few days ago and got tobramycin -- she started this yesterday.  Still having some itching., but her sx are the best today that they've been this whole time. ? ?Eyelid twitch ?Left eye lower lid is twitching -- has recently started drinking Coke recently. Under a lot of stress with her parents health, working FT and children's schedules. Compliant with thyroid meds. Denies severe consistent HA, significant vision disturbances, unusual numbness/tingling. ? ? ? ?Past Medical History:  ?Diagnosis Date  ? Anemia   ? during pregnancy  ? Cancer of thyroid ([place]) 09/07/2015  ? 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 GERD (gastroesophageal reflux disease)   ? Headache   ? Hypercholesteremia 12/21/2013  ? Mediastinal lymphadenopathy   ? Neuropathy   ? Pneumonia   ? Postoperative hypothyroidism 09/07/2015  ? Seasonal allergies 07/20/2016  ? Thyroid cancer ([place])   ? Vision abnormalities   ? ?  ?Social History  ? ?Tobacco Use  ? Smoking status: Never  ? Smokeless tobacco: Never  ?Vaping Use  ? Vaping Use: Never used  ?Substance Use Topics  ? Alcohol use: Yes  ?  Comment: rare glass of wine  ? Drug use: No  ? ? ?Past Surgical History:  ?Procedure Laterality Date  ? DILATION AND CURETTAGE OF UTERUS    ?  THYROIDECTOMY, PARTIAL    ? full  ? VIDEO BRONCHOSCOPY WITH ENDOBRONCHIAL ULTRASOUND N/A 09/08/2018  ? Procedure: VIDEO BRONCHOSCOPY WITH ENDOBRONCHIAL ULTRASOUND AND BIOPSIES;  Surgeon: Collene Gobble, MD;  Location: [facility];  Service: Thoracic;  Laterality: N/A;  ? ? ?Family History  ?Problem Relation Age of Onset  ? Healthy Mother   ? CAD Father   ? Allergies Father   ? Ulcerative colitis Father   ? Tremor Father   ? Neuropathy Neg Hx   ? ? ?Allergies  ?Allergen Reactions  ? Pseudoephedrine-Guaifenesin Er Other (See Comments)  ? ? ?Current Medications:  ? ?Current Outpatient Medications:  ?  acetaminophen (TYLENOL) 500 MG tablet, Take 1,000 mg by mouth daily as needed for moderate pain or headache., Disp: , Rfl:  ?  albuterol (VENTOLIN HFA) 108 (90 Base) MCG/ACT inhaler, Inhale 2 puffs into the lungs every 6 (six) hours as needed for wheezing or shortness of breath. , Disp: , Rfl:  ?  ALPRAZolam (XANAX) 0.5 MG tablet, Take 1 tablet (0.5 mg total) by mouth as needed (flying)., Disp: 30 tablet, Rfl: 0 ?  diphenhydrAMINE (BENADRYL) 25 MG tablet, Take 25 mg by mouth daily as needed for allergies., Disp: , Rfl:  ?  doxycycline (VIBRA-TABS) 100 MG tablet, Take 1 tablet (100 mg total) by mouth 2 (two) times daily., Disp: 20 tablet, Rfl: 0 ?  fexofenadine (ALLEGRA) 180 MG tablet, Take 180 mg by mouth every evening., Disp: , Rfl:  ?  hydrOXYzine (ATARAX) 25 MG tablet, Take 0.5-1 tablet nightly as needed for sleep., Disp: 30 tablet, Rfl: 1 ?  ibuprofen (ADVIL) 200 MG tablet, Take 400 mg by mouth daily as needed for headache or moderate pain., Disp: , Rfl:  ?  levothyroxine (SYNTHROID) 100 MCG tablet, Take 1 tablet (100 mcg total) by mouth daily before breakfast., Disp: 90 tablet, Rfl: 0 ?  Multiple Vitamin (MULTIVITAMIN WITH MINERALS) TABS tablet, Take 1 tablet by mouth 3 (three) times a week. , Disp: , Rfl:  ?  nitroGLYCERIN (NITRODUR - DOSED IN MG/24 HR) 0.2 mg/hr patch, APPLY 1/4 PATCH DAILY TO TENDON FOR TENDONITIS.,  Disp: 30 patch, Rfl: 1 ?  Olopatadine HCl 0.2 % SOLN, Place 1-2 drops into both eyes daily as needed (allergies). , Disp: , Rfl:  ?  Olopatadine HCl 0.6 % SOLN, Place 2 sprays into both nostrils 2 (two) times daily., Disp: , Rfl:  ?  OVER THE COUNTER MEDICATION, Take 2 tablets by mouth daily in the afternoon. Zinc, Magnesium and D3, Disp: , Rfl:  ?  traZODone (DESYREL) 50 MG tablet, TAKE 0.5-1 TABLETS BY MOUTH AT BEDTIME AS NEEDED FOR SLEEP., Disp: 90 tablet, Rfl: 0  ? ?Review of Systems:  ? ?ROS ?Negative unless otherwise specified per HPI. ? ?Vitals:  ? ?Vitals:  ? 06/06/21 1123  ?BP: 132/88  ?Pulse: 79  ?Temp: 98 ?F (36.7 ?C)  ?TempSrc: Temporal  ?SpO2: 98%  ?Weight: 188 lb 6.4 oz (85.5 kg)  ?Height: '5\' 8"'$  (1.727 m)  ?   ?Body mass index is 28.65 kg/m?. ? ?Physical Exam:  ? ?Physical Exam ?Vitals and nursing note reviewed.  ?Constitutional:   ?   General: She is not in acute distress. ?   Appearance: She is well-developed. She is not ill-appearing or toxic-appearing.  ?HENT:  ?   Head: Normocephalic and atraumatic.  ?   Right Ear: Tympanic membrane, ear canal and external ear normal. Tympanic membrane is not erythematous, retracted or bulging.  ?   Left Ear: Tympanic membrane, ear canal and external ear normal. Tympanic membrane is not erythematous, retracted or bulging.  ?   Nose: Nose normal.  ?   Right Sinus: No maxillary sinus tenderness or frontal sinus tenderness.  ?   Left Sinus: No maxillary sinus tenderness or frontal sinus tenderness.  ?   Mouth/Throat:  ?   Pharynx: Uvula midline. No posterior oropharyngeal erythema.  ?Eyes:  ?   General: Lids are normal.  ?   Conjunctiva/sclera: Conjunctivae normal.  ?Neck:  ?   Trachea: Trachea normal.  ?Cardiovascular:  ?   Rate and Rhythm: Normal rate and regular rhythm.  ?   Pulses: Normal pulses.  ?   Heart sounds: Normal heart sounds, S1 normal and S2 normal.  ?Pulmonary:  ?   Effort: Pulmonary effort is normal.  ?   Breath sounds: Normal breath sounds. No  decreased breath sounds, wheezing, rhonchi or rales.  ?Lymphadenopathy:  ?   Cervical: No cervical adenopathy.  ?Skin: ?   General: Skin is warm and dry.  ?Neurological:  ?   Mental Status: She is alert.  ?   GCS: GCS eye subscore is 4. GCS verbal subscore is 5. GCS motor subscore is 6.  ?Psychiatric:     ?   Speech: Speech normal.     ?   Behavior: Behavior normal. Behavior is cooperative.  ? ? ?  Assessment and Plan:  ? ?Acute bacterial conjunctivitis of right eye ?No red flags ?Improving as anticipated ?Continue tobramcyin  ?Follow-up with eye doctor for regular exam or with [place] if any acute issues ? ?Eyelid twitch ?No red flags ?Exam benign ?Follow-up with eye doctor for regular exam or with [place] if any acute issues ? ? ?[name], PA-C ?

## 2021-06-15 ENCOUNTER — Other Ambulatory Visit: Payer: Self-pay | Admitting: Physician Assistant

## 2021-08-05 ENCOUNTER — Encounter: Payer: Self-pay | Admitting: Physician Assistant

## 2021-08-05 ENCOUNTER — Ambulatory Visit: Payer: 59 | Admitting: Physician Assistant

## 2021-08-05 VITALS — BP 120/80 | HR 84 | Temp 97.5°F | Ht 68.0 in | Wt 190.0 lb

## 2021-08-05 DIAGNOSIS — E7800 Pure hypercholesterolemia, unspecified: Secondary | ICD-10-CM

## 2021-08-05 DIAGNOSIS — R229 Localized swelling, mass and lump, unspecified: Secondary | ICD-10-CM

## 2021-08-05 DIAGNOSIS — R94120 Abnormal auditory function study: Secondary | ICD-10-CM

## 2021-08-05 DIAGNOSIS — H9313 Tinnitus, bilateral: Secondary | ICD-10-CM | POA: Diagnosis not present

## 2021-08-05 LAB — LIPID PANEL
Cholesterol: 238 mg/dL — ABNORMAL HIGH (ref 0–200)
HDL: 67.6 mg/dL (ref >39.00)
LDL Cholesterol: 133 mg/dL — ABNORMAL HIGH (ref 0–99)
NonHDL: 170.77
Total CHOL/HDL Ratio: 4
Triglycerides: 188 mg/dL — ABNORMAL HIGH (ref 0.0–149.0)
VLDL: 37.6 mg/dL (ref 0.0–40.0)

## 2021-08-05 NOTE — Patient Instructions (Addendum)
It was great to see you!  We will update your cholesterol today -- please see results in mychart. Continue to work on exercise over the summer.  We will get you scheduled for an ultrasound of the lump in your abdomen -- let me know if you do not hear anything within the next 1-2 weeks for this.  I recommend referral to [facility] -- I will place referral today. This likely the culprit of your tinnitus.  Take care,  [name] PA-C

## 2021-08-05 NOTE — Progress Notes (Signed)
Barbara Cardenas is a 51 y.o. female here for a new problem.  History of Present Illness:   Chief Complaint  Patient presents with  . Mass    Pt c/o lump left side of abdomen noticed it Nov or Dec. Denies pain.  . Tinnitus    Pt is c/o ringing in ears and is getting worse.    HPI  Skin Nodule Patient complain on lump that has been onset for the past 7-8 months. Located on left side of her abdomen. States she noticed this several months ago when taking shower. She has not noticed any increase in size since then. States she noticed this again about a week and is concerned about this issue. Denies any pain. Denies any aggravating symptoms. Denies any other lumps/bumps. Denies any worsening sx.   Tinnitus  Patient complain of ringing in bilateral ear that has been going on for a while. Symptoms seems to be getting worse. States symptoms are worse in the right ear. Has had noticed some hearing loss. She described this as " slouching water" sounds. Denies pain or hx of loud exposure. Denies headaches or weakness.   HLD She wants her cholesterol updated today. She was recently working full time and has had less time for self-care. She is now working out regularly now that she has a break from school/work.    Past Medical History:  Diagnosis Date  . Anemia    during pregnancy  . Cancer of thyroid ([place]) 09/07/2015  . 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 GERD (gastroesophageal reflux disease)   . Headache   . Hypercholesteremia 12/21/2013  . Mediastinal lymphadenopathy   . Neuropathy   . Pneumonia   . Postoperative hypothyroidism 09/07/2015  . Seasonal allergies 07/20/2016  . Thyroid cancer ([place])   . Vision abnormalities      Social History   Tobacco Use  . Smoking status: Never  . Smokeless tobacco: Never  Vaping Use  . Vaping Use: Never used  Substance Use Topics  . Alcohol use: Yes    Comment: rare glass of wine  . Drug use: No    Past Surgical History:  Procedure  Laterality Date  . DILATION AND CURETTAGE OF UTERUS    . THYROIDECTOMY, PARTIAL     full  . VIDEO BRONCHOSCOPY WITH ENDOBRONCHIAL ULTRASOUND N/A 09/08/2018   Procedure: VIDEO BRONCHOSCOPY WITH ENDOBRONCHIAL ULTRASOUND AND BIOPSIES;  Surgeon: Collene Gobble, MD;  Location[facility];  Service: Thoracic;  Laterality: N/A;    Family History  Problem Relation Age of Onset  . Healthy Mother   . CAD Father   . Allergies Father   . Ulcerative colitis Father   . Tremor Father   . Neuropathy Neg Hx     Allergies  Allergen Reactions  . Latex Hives  . Pseudoephedrine-Guaifenesin Er Other (See Comments)    Current Medications:   Current Outpatient Medications:  .  acetaminophen (TYLENOL) 500 MG tablet, Take 1,000 mg by mouth daily as needed for moderate pain or headache., Disp: , Rfl:  .  albuterol (VENTOLIN HFA) 108 (90 Base) MCG/ACT inhaler, Inhale 2 puffs into the lungs every 6 (six) hours as needed for wheezing or shortness of breath. , Disp: , Rfl:  .  ALPRAZolam (XANAX) 0.5 MG tablet, Take 1 tablet (0.5 mg total) by mouth as needed (flying)., Disp: 30 tablet, Rfl: 0 .  diphenhydrAMINE (BENADRYL) 25 MG tablet, Take 25 mg by mouth daily as needed for allergies., Disp: , Rfl:  .  fexofenadine (ALLEGRA) 180 MG tablet, Take 180 mg by mouth every evening., Disp: , Rfl:  .  hydrOXYzine (ATARAX) 25 MG tablet, TAKE 0.5-1 TABLET NIGHTLY AS NEEDED FOR SLEEP., Disp: 90 tablet, Rfl: 1 .  ibuprofen (ADVIL) 200 MG tablet, Take 400 mg by mouth daily as needed for headache or moderate pain., Disp: , Rfl:  .  levothyroxine (SYNTHROID) 100 MCG tablet, Take 1 tablet (100 mcg total) by mouth daily before breakfast., Disp: 90 tablet, Rfl: 0 .  Multiple Vitamin (MULTIVITAMIN WITH MINERALS) TABS tablet, Take 1 tablet by mouth 3 (three) times a week. , Disp: , Rfl:  .  nitroGLYCERIN (NITRODUR - DOSED IN MG/24 HR) 0.2 mg/hr patch, APPLY 1/4 PATCH DAILY TO TENDON FOR TENDONITIS., Disp: 30 patch, Rfl: 1 .   Olopatadine HCl 0.2 % SOLN, Place 1-2 drops into both eyes daily as needed (allergies). , Disp: , Rfl:  .  Olopatadine HCl 0.6 % SOLN, Place 2 sprays into both nostrils 2 (two) times daily., Disp: , Rfl:  .  traZODone (DESYREL) 50 MG tablet, TAKE 0.5-1 TABLETS BY MOUTH AT BEDTIME AS NEEDED FOR SLEEP., Disp: 90 tablet, Rfl: 0   Review of Systems:   ROS Negative unless otherwise specified per HPI.   Vitals:   Vitals:   08/05/21 0917  BP: 120/80  Pulse: 84  Temp: (!) 97.5 F (36.4 C)  TempSrc: Temporal  SpO2: 97%  Weight: 190 lb (86.2 kg)  Height: '5\' 8"'$  (1.727 m)     Body mass index is 28.89 kg/m.     Physical Exam:   Physical Exam Vitals and nursing note reviewed.  Constitutional:      General: She is not in acute distress.    Appearance: She is well-developed. She is not ill-appearing or toxic-appearing.  Cardiovascular:     Rate and Rhythm: Normal rate and regular rhythm.     Pulses: Normal pulses.     Heart sounds: Normal heart sounds, S1 normal and S2 normal.  Pulmonary:     Effort: Pulmonary effort is normal.     Breath sounds: Normal breath sounds.  Abdominal:     Comments: Palpable lump in LUQ  Skin:    General: Skin is warm and dry.  Neurological:     Mental Status: She is alert.     GCS: GCS eye subscore is 4. GCS verbal subscore is 5. GCS motor subscore is 6.  Psychiatric:        Speech: Speech normal.        Behavior: Behavior normal. Behavior is cooperative.    Hearing Screening  Method: Audiometry    Right ear  Left ear  Comments: Right ear- failed Left ear- failed    Assessment and Plan:   Localized skin mass, lump, or swelling Unclear etiology Will obtain u/s for further evaluation of this mass Intervention based on results  Hypercholesteremia Update cholesterol Recommendations based on results  Tinnitus of both ears Failed hearing exam Will refer to audiology   I,Savera Zaman,acting as a scribe for [name], PA.,have  documented all relevant documentation on the behalf of [name], PA,as directed by  [name], PA while in the presence of [name], [place].   I, [name], [place], have reviewed all documentation for this visit. The documentation on 08/05/21 for the exam, diagnosis, procedures, and orders are all accurate and complete.  [name], PA-C

## 2021-08-09 ENCOUNTER — Ambulatory Visit
Admission: RE | Admit: 2021-08-09 | Discharge: 2021-08-09 | Disposition: A | Discharge status: Home / Self Care | Payer: 59 | Source: Ambulatory Visit | Attending: Physician Assistant | Admitting: Physician Assistant

## 2021-08-09 DIAGNOSIS — R229 Localized swelling, mass and lump, unspecified: Secondary | ICD-10-CM

## 2021-08-21 ENCOUNTER — Encounter: Payer: Self-pay | Admitting: Physician Assistant

## 2021-08-21 ENCOUNTER — Ambulatory Visit: Payer: 59 | Admitting: Physician Assistant

## 2021-08-21 VITALS — BP 146/97 | HR 82 | Temp 97.6°F | Ht 68.0 in | Wt 192.0 lb

## 2021-08-21 DIAGNOSIS — F4541 Pain disorder exclusively related to psychological factors: Secondary | ICD-10-CM

## 2021-08-21 DIAGNOSIS — R030 Elevated blood-pressure reading, without diagnosis of hypertension: Secondary | ICD-10-CM | POA: Diagnosis not present

## 2021-08-21 NOTE — Progress Notes (Unsigned)
Subjective:    Patient ID: Barbara Cardenas, female    DOB: 1971/01/20, 51 y.o.   MRN: [id number]  Chief Complaint  Patient presents with   Sinusitis    HPI Patient is in today for possible sinusitis. Pressure feeling in head / ears. Some dizziness with bending over and standing back up. Some sinus congestion. Indoor /outdoor allergies. Some congestion. Taken Benadryl a few times in the last few days. Recently went to [place] last week, traveled with husband who has liver disease and says this is stressful. Headache across front of head and back of head. Has taken ibuprofen twice daily the last 4 days. Feels tired. Stressed about son going out of country soon.  No CP or SOB. No n/v/d. No ST or fever. No chills.   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   GERD (gastroesophageal reflux disease)    Headache    Hypercholesteremia 12/21/2013   Mediastinal lymphadenopathy    Neuropathy    Pneumonia    Postoperative hypothyroidism 09/07/2015   Seasonal allergies 07/20/2016   Thyroid cancer ([place])    Vision abnormalities     Past Surgical History:  Procedure Laterality Date   DILATION AND CURETTAGE OF UTERUS     THYROIDECTOMY, PARTIAL     full   VIDEO BRONCHOSCOPY WITH ENDOBRONCHIAL ULTRASOUND N/A 09/08/2018   Procedure: VIDEO BRONCHOSCOPY WITH ENDOBRONCHIAL ULTRASOUND AND BIOPSIES;  Surgeon: Collene Gobble, MD;  Location: [facility];  Service: Thoracic;  Laterality: N/A;    Family History  Problem Relation Age of Onset   Healthy Mother    CAD Father    Allergies Father    Ulcerative colitis Father    Tremor Father    Neuropathy Neg Hx     Social History   Tobacco Use   Smoking status: Never   Smokeless tobacco: Never  Vaping Use   Vaping Use: Never used  Substance Use Topics   Alcohol use: Yes    Comment: rare glass of wine   Drug use: No     Allergies  Allergen  Reactions   Latex Hives   Pseudoephedrine-Guaifenesin Er Other (See Comments)    Review of Systems NEGATIVE UNLESS OTHERWISE INDICATED IN HPI      Objective:     BP (!) 137/97   Pulse 82   Temp 97.6 F (36.4 C) (Temporal)   Ht '5\' 8"'$  (1.727 m)   Wt 192 lb (87.1 kg)   LMP 02/26/2021   SpO2 97%   BMI 29.19 kg/m   Wt Readings from Last 3 Encounters:  08/21/21 192 lb (87.1 kg)  08/05/21 190 lb (86.2 kg)  06/06/21 188 lb 6.4 oz (85.5 kg)    BP Readings from Last 3 Encounters:  08/21/21 (!) 137/97  08/05/21 120/80  06/06/21 132/88     Physical Exam     Assessment & Plan:   Problem List Items Addressed This Visit   None    No orders of the defined types were placed in this encounter.    No follow-ups on file.  This note was prepared with assistance of Systems analyst. Occasional wrong-word or sound-a-like substitutions may have occurred due to the inherent limitations of voice recognition software.  Time Spent: *** minutes of total time was spent on the date of the encounter performing the following actions: chart review prior to seeing the  patient, obtaining history, performing a medically necessary exam, counseling on the treatment plan, placing orders, and documenting in our EHR.       Corinthia Helmers M Mattalynn Crandle, PA-C

## 2021-09-09 ENCOUNTER — Encounter: Payer: Self-pay | Admitting: Physician Assistant

## 2021-09-09 ENCOUNTER — Ambulatory Visit: Payer: 59 | Admitting: Physician Assistant

## 2021-09-09 VITALS — BP 150/100 | HR 85 | Temp 97.6°F | Ht 68.0 in | Wt 193.5 lb

## 2021-09-09 DIAGNOSIS — F419 Anxiety disorder, unspecified: Secondary | ICD-10-CM | POA: Diagnosis not present

## 2021-09-09 DIAGNOSIS — R030 Elevated blood-pressure reading, without diagnosis of hypertension: Secondary | ICD-10-CM | POA: Diagnosis not present

## 2021-09-09 LAB — COMPREHENSIVE METABOLIC PANEL
ALT: 18 U/L (ref 0–35)
AST: 18 U/L (ref 0–37)
Albumin: 4.7 g/dL (ref 3.5–5.2)
Alkaline Phosphatase: 85 U/L (ref 39–117)
BUN: 13 mg/dL (ref 6–23)
CO2: 28 mEq/L (ref 19–32)
Calcium: 10 mg/dL (ref 8.4–10.5)
Chloride: 104 mEq/L (ref 96–112)
Creatinine, Ser: 0.67 mg/dL (ref 0.40–1.20)
GFR: 102.33 mL/min (ref >60.00)
Glucose, Bld: 101 mg/dL — ABNORMAL HIGH (ref 70–99)
Potassium: 4 mEq/L (ref 3.5–5.1)
Sodium: 142 mEq/L (ref 135–145)
Total Bilirubin: 0.7 mg/dL (ref 0.2–1.2)
Total Protein: 7.6 g/dL (ref 6.0–8.3)

## 2021-09-09 LAB — T4, FREE: Free T4: 0.76 ng/dL (ref 0.60–1.60)

## 2021-09-09 LAB — TSH: TSH: 1.7 u[IU]/mL (ref 0.35–5.50)

## 2021-09-09 LAB — T3, FREE: T3, Free: 3.1 pg/mL (ref 2.3–4.2)

## 2021-09-09 MED ORDER — AMLODIPINE BESYLATE 5 MG PO TABS: 5.0000 mg | ORAL_TABLET | Freq: Every day | ORAL | 1 refills | Status: DC

## 2021-09-09 NOTE — Progress Notes (Signed)
Barbara Cardenas is a 51 y.o. female here for a follow up on elevated BP.   History of Present Illness:   Chief Complaint  Patient presents with   c/o elevated blood pressure    Pt has been checking BP has been elevated, lowest was 139/95    HPI  Elevated Blood Pressure Readings  Currently taking no medication. At home blood pressure readings are: checked twice daily. She has had increased anxiety in the past and thinks this could be contributing to her symptoms.States her blood pressure at home has been 139/95. Has been getting headaches everyday recently. Patient denies chest pain, SOB, blurred vision, dizziness, unusual headaches, lower leg swelling. Has been trying to eat healthy diet and has been walking regularly. At this time. She interested to trial medication if possible. Denies excessive caffeine intake, stimulant usage, excessive alcohol intake, or increase in salt consumption.  BP Readings from Last 3 Encounters:  09/09/21 (!) 160/106  08/21/21 (!) 146/97  08/05/21 120/80   Anxiety Patient is currently compliant with taking Xanax 0.5 mg as needed. States she has had issue with sleeping but takes Tylenol PM and Xanax. This has been helping. She is tolerating well with no side effects. Currently she has been under lots of stress due to her father's illness and other family issue. She is interested in talk therapy for her symptoms. Denies any worsening sx. Denies SI/HI.   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   GERD (gastroesophageal reflux disease)    Headache    Hypercholesteremia 12/21/2013   Mediastinal lymphadenopathy    Neuropathy    Pneumonia    Postoperative hypothyroidism 09/07/2015   Seasonal allergies 07/20/2016   Thyroid cancer ([place])    Vision abnormalities      Social History   Tobacco Use   Smoking status: Never   Smokeless tobacco: Never  Vaping Use    Vaping Use: Never used  Substance Use Topics   Alcohol use: Yes    Comment: rare glass of wine   Drug use: No    Past Surgical History:  Procedure Laterality Date   DILATION AND CURETTAGE OF UTERUS     THYROIDECTOMY, PARTIAL     full   VIDEO BRONCHOSCOPY WITH ENDOBRONCHIAL ULTRASOUND N/A 09/08/2018   Procedure: VIDEO BRONCHOSCOPY WITH ENDOBRONCHIAL ULTRASOUND AND BIOPSIES;  Surgeon: Collene Gobble, MD;  Location: [facility];  Service: Thoracic;  Laterality: N/A;    Family History  Problem Relation Age of Onset   Healthy Mother    CAD Father    Allergies Father    Ulcerative colitis Father    Tremor Father    Neuropathy Neg Hx     Allergies  Allergen Reactions   Latex Hives   Pseudoephedrine-Guaifenesin Er Other (See Comments)    Current Medications:   Current Outpatient Medications:    acetaminophen (TYLENOL) 500 MG tablet, Take 1,000 mg by mouth daily as needed for moderate pain or headache., Disp: , Rfl:    albuterol (VENTOLIN HFA) 108 (90 Base) MCG/ACT inhaler, Inhale 2 puffs into the lungs every 6 (six) hours as needed for wheezing or shortness of breath. , Disp: , Rfl:    ALPRAZolam (XANAX) 0.5 MG tablet, Take 1 tablet (0.5 mg total) by mouth as needed (flying)., Disp: 30 tablet, Rfl: 0   diphenhydrAMINE (BENADRYL) 25 MG tablet, Take 25 mg by mouth daily  as needed for allergies., Disp: , Rfl:    fexofenadine (ALLEGRA) 180 MG tablet, Take 180 mg by mouth every evening., Disp: , Rfl:    hydrOXYzine (ATARAX) 25 MG tablet, TAKE 0.5-1 TABLET NIGHTLY AS NEEDED FOR SLEEP., Disp: 90 tablet, Rfl: 1   ibuprofen (ADVIL) 200 MG tablet, Take 400 mg by mouth daily as needed for headache or moderate pain., Disp: , Rfl:    levothyroxine (SYNTHROID) 100 MCG tablet, Take 1 tablet (100 mcg total) by mouth daily before breakfast., Disp: 90 tablet, Rfl: 0   Multiple Vitamin (MULTIVITAMIN WITH MINERALS) TABS tablet, Take 1 tablet by mouth 3 (three) times a week. , Disp: , Rfl:     nitroGLYCERIN (NITRODUR - DOSED IN MG/24 HR) 0.2 mg/hr patch, APPLY 1/4 PATCH DAILY TO TENDON FOR TENDONITIS., Disp: 30 patch, Rfl: 1   Olopatadine HCl 0.2 % SOLN, Place 1-2 drops into both eyes daily as needed (allergies). , Disp: , Rfl:    Olopatadine HCl 0.6 % SOLN, Place 2 sprays into both nostrils 2 (two) times daily., Disp: , Rfl:    traZODone (DESYREL) 50 MG tablet, TAKE 0.5-1 TABLETS BY MOUTH AT BEDTIME AS NEEDED FOR SLEEP., Disp: 90 tablet, Rfl: 0   Review of Systems:   ROS Negative unless otherwise specified per HPI.   Vitals:   Vitals:   09/09/21 1402  BP: (!) 160/106  Pulse: 86  Temp: 97.6 F (36.4 C)  TempSrc: Temporal  SpO2: 97%  Weight: 193 lb 8 oz (87.8 kg)  Height: '5\' 8"'$  (1.727 m)     Body mass index is 29.42 kg/m.  Physical Exam:   Physical Exam Vitals and nursing note reviewed.  Constitutional:      General: She is not in acute distress.    Appearance: She is well-developed. She is not ill-appearing or toxic-appearing.  Cardiovascular:     Rate and Rhythm: Normal rate and regular rhythm.     Pulses: Normal pulses.     Heart sounds: Normal heart sounds, S1 normal and S2 normal.  Pulmonary:     Effort: Pulmonary effort is normal.     Breath sounds: Normal breath sounds.  Skin:    General: Skin is warm and dry.  Neurological:     Mental Status: She is alert.     GCS: GCS eye subscore is 4. GCS verbal subscore is 5. GCS motor subscore is 6.  Psychiatric:        Speech: Speech normal.        Behavior: Behavior normal. Behavior is cooperative.     Assessment and Plan:   Elevated blood pressure reading Above goal Suspect that anxiety is playing a role however home BP's are also above goal Start amlodipine 5 mg daily Continue to monitor BP Follow-up in 3 months, sooner if concerns  Anxiety Uncontrolled Denies daily medication -- will continue prn xanax Continue to monitor Follow-up in 3 months, sooner if concerns  I,Savera Zaman,acting as a  scribe for [employer], PA.,have documented all relevant documentation on the behalf of [name], PA,as directed by  [name], PA while in the presence of [name], [place].   I, [name], [place], have reviewed all documentation for this visit. The documentation on 09/09/21 for the exam, diagnosis, procedures, and orders are all accurate and complete.  [name], PA-C

## 2021-09-09 NOTE — Patient Instructions (Addendum)
It was great to see you!  Check blood pressure regularly  Start amlodipine 5 mg daily  Talk therapy referral placed today  Let's follow-up in 3 months, sooner if you have concerns.  If a referral was placed today --> you will be contacted for an appointment. Please note that routine referrals can sometimes take up to 3-4 weeks to process. Please call our office if you haven't heard anything after this time frame.  If blood work, urine studies, or any imaging was ordered today -->  we will release your results to you on your MyChart account (if you have chosen to sign up for this) with further instructions. You may see the results before I do, but when I review them I will send you a message with my report or have my staff call you if things need to be discussed. Please reply to my message with any questions.   Take care,  [name] PA-C

## 2021-09-24 ENCOUNTER — Telehealth: Payer: Self-pay | Admitting: Physician Assistant

## 2021-09-25 NOTE — Telephone Encounter (Signed)
Error

## 2021-10-01 ENCOUNTER — Ambulatory Visit: Payer: 59 | Admitting: Behavioral Health

## 2021-10-01 ENCOUNTER — Ambulatory Visit (INDEPENDENT_AMBULATORY_CARE_PROVIDER_SITE_OTHER): Payer: 59 | Admitting: Behavioral Health

## 2021-10-01 DIAGNOSIS — F4323 Adjustment disorder with mixed anxiety and depressed mood: Secondary | ICD-10-CM

## 2021-10-01 DIAGNOSIS — F4321 Adjustment disorder with depressed mood: Secondary | ICD-10-CM | POA: Diagnosis not present

## 2021-10-01 NOTE — Progress Notes (Signed)
                Kamare Caspers L Victorious Kundinger, LMFT 

## 2021-10-01 NOTE — Progress Notes (Signed)
[place] [facility] Counselor Initial Adult Exam  Name: Barbara Cardenas Date: 10/01/2021 MRN: [id number] DOB: 03-19-1970 PCP: [name], PA  Time spent: 60 min In-Person visit @ [place]:  Self    Paperwork requested: No   Reason for Visit /Presenting Problem: Pt is mourning the death of her Father on 07/19/2021. Husb currently has Dx of Schlerosing if the Liver.  Mental Status Exam: Appearance:   NA     Behavior:  Appropriate and Sharing  Motor:  Normal  Speech/Language:   Clear and Coherent  Affect:  Appropriate  Mood:  normal  Thought process:  normal  Thought content:    WNL  Sensory/Perceptual disturbances:    WNL  Orientation:  oriented to person, place, and time/date  Attention:  Good  Concentration:  Good  Memory:  WNL  Fund of knowledge:   Good  Insight:    Good  Judgment:   Good  Impulse Control:  Good    Reported Symptoms:  Pt is anxious due to recent death of her Father. She & Husb & children are in a Rental home & this is, "unsettling". Pt is not sleeping well.   Risk Assessment: Danger to Self:  No Self-injurious Behavior: No Danger to Others: No Duty to Warn:no Physical Aggression / Violence:No  Access to Firearms a concern: No  Gang Involvement:No  Patient / guardian was educated about steps to take if suicide or homicide risk level increases between visits: yes; Pt is aware of appropriate resources While future psychiatric events cannot be accurately predicted, the patient does not currently require acute inpatient psychiatric care and does not currently meet [facility] involuntary commitment criteria.  Substance Abuse History: Current substance abuse: No     Past Psychiatric History:   No previous psychological problems have been observed Outpatient Providers:Samantha Morene Rankins, PA-C History of Psych Hospitalization: No  Psychological Testing:  NA    Abuse History:  Victim of: No.,  NA     Report needed: No. Victim of Neglect:No. Perpetrator of  NA   Witness / Exposure to Domestic Violence: No   Protective Services Involvement: No  Witness to [employer] Violence:  No   Family History:  Family History  Problem Relation Age of Onset   Healthy Mother    CAD Father    Allergies Father    Ulcerative colitis Father    Tremor Father    Neuropathy Neg Hx     Living situation: the patient lives with their family  Sexual Orientation: Straight  Relationship Status: married  Name of spouse: [name] If a parent, number of children / ages:15yo Barbara Cardenas who is in 9th Gr @ [employer] & has Dx of ADHD (takes medication), 50yo Barbara Cardenas who is a Sr @ [employer] & has a Hx of visual therapy in 3rd Gr for tracking, & 60yo Son Barbara Cardenas who attends [employer] in [place]: spouse friends  Financial Stress:  No   Income/Employment/Disability: Employment; Pt is a PT Substitute @ [employer] Service: No   Educational History: Education: college graduate  Religion/Sprituality/World View: "Engineer, site"  Any cultural differences that may affect / interfere with treatment:  None noted today  Recreation/Hobbies: Unk  Stressors: Loss of Father in May 2023 & consequences for her Mother (memory issues in the past 2 yrs) who is very lonely after 19 yrs of marriage. Parents met in 7th Gr.    Strengths: Supportive Relationships,  Family, Church, and Able to Communicate Effectively  Barriers:  None noted today   Legal History: Pending legal issue / charges: The patient has no significant history of legal issues. 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   GERD (gastroesophageal reflux disease)    Headache    Hypercholesteremia 12/21/2013   Mediastinal lymphadenopathy    Neuropathy     Pneumonia    Postoperative hypothyroidism 09/07/2015   Seasonal allergies 07/20/2016   Thyroid cancer ([place])    Vision abnormalities     Past Surgical History:  Procedure Laterality Date   DILATION AND CURETTAGE OF UTERUS     THYROIDECTOMY, PARTIAL     full   VIDEO BRONCHOSCOPY WITH ENDOBRONCHIAL ULTRASOUND N/A 09/08/2018   Procedure: VIDEO BRONCHOSCOPY WITH ENDOBRONCHIAL ULTRASOUND AND BIOPSIES;  Surgeon: Collene Gobble, MD;  Location: [facility] OR;  Service: Thoracic;  Laterality: N/A;    Medications: Current Outpatient Medications  Medication Sig Dispense Refill   acetaminophen (TYLENOL) 500 MG tablet Take 1,000 mg by mouth daily as needed for moderate pain or headache.     albuterol (VENTOLIN HFA) 108 (90 Base) MCG/ACT inhaler Inhale 2 puffs into the lungs every 6 (six) hours as needed for wheezing or shortness of breath.      ALPRAZolam (XANAX) 0.5 MG tablet Take 1 tablet (0.5 mg total) by mouth as needed (flying). 30 tablet 0   amLODipine (NORVASC) 5 MG tablet Take 1 tablet (5 mg total) by mouth daily. 90 tablet 1   diphenhydrAMINE (BENADRYL) 25 MG tablet Take 25 mg by mouth daily as needed for allergies.     fexofenadine (ALLEGRA) 180 MG tablet Take 180 mg by mouth every evening.     hydrOXYzine (ATARAX) 25 MG tablet TAKE 0.5-1 TABLET NIGHTLY AS NEEDED FOR SLEEP. 90 tablet 1   ibuprofen (ADVIL) 200 MG tablet Take 400 mg by mouth daily as needed for headache or moderate pain.     levothyroxine (SYNTHROID) 100 MCG tablet Take 1 tablet (100 mcg total) by mouth daily before breakfast. 90 tablet 0   Multiple Vitamin (MULTIVITAMIN WITH MINERALS) TABS tablet Take 1 tablet by mouth 3 (three) times a week.      nitroGLYCERIN (NITRODUR - DOSED IN MG/24 HR) 0.2 mg/hr patch APPLY 1/4 PATCH DAILY TO TENDON FOR TENDONITIS. 30 patch 1   Olopatadine HCl 0.2 % SOLN Place 1-2 drops into both eyes daily as needed (allergies).      Olopatadine HCl 0.6 % SOLN Place 2 sprays into both nostrils 2 (two) times  daily.     traZODone (DESYREL) 50 MG tablet TAKE 0.5-1 TABLETS BY MOUTH AT BEDTIME AS NEEDED FOR SLEEP. 90 tablet 0   No current facility-administered medications for this visit.    Allergies  Allergen Reactions   Latex Hives   Pseudoephedrine-Guaifenesin Er Other (See Comments)    Diagnoses:  Adjustment rxn with mixed anxiety & dep'd mood Grief reaction  Plan of Care: ST: Begin to keep a notebook to record your thoughts & feelings btwn sessions to keep our work focused & to provide time for yourself to process.  LT: Process loss of your Father, your Mother's & Husb's health issues. Manage your anxiety using tools suggested; https://burns.com/. Limit your exposure to the News. Explore options for navigating menopause. Try the Sleep Hygiene Tips  suggested.   Donnetta Hutching, LMFT

## 2021-10-03 ENCOUNTER — Ambulatory Visit: Payer: 59 | Admitting: Behavioral Health

## 2021-10-15 ENCOUNTER — Ambulatory Visit: Payer: 59 | Admitting: Behavioral Health

## 2021-10-31 ENCOUNTER — Ambulatory Visit (INDEPENDENT_AMBULATORY_CARE_PROVIDER_SITE_OTHER): Payer: 59 | Admitting: Behavioral Health

## 2021-10-31 DIAGNOSIS — F4321 Adjustment disorder with depressed mood: Secondary | ICD-10-CM | POA: Diagnosis not present

## 2021-10-31 DIAGNOSIS — F4323 Adjustment disorder with mixed anxiety and depressed mood: Secondary | ICD-10-CM

## 2021-10-31 NOTE — Progress Notes (Signed)
                Barbara Cardenas L Twanda Stakes, LMFT 

## 2021-10-31 NOTE — Progress Notes (Signed)
[facility] Counselor/Therapist Progress Note  Patient ID: Deshawn Skelley, MRN: [id number],    Date: 10/31/2021  Time Spent: 84 min In Person session @ [facility] Office   Treatment Type: Individual Therapy  Reported Symptoms: Pt is grieving the death of her Father in 07/08/21 & shared many stories of him & her Family growing up.  Mental Status Exam: Appearance:  Well Groomed     Behavior: Appropriate and Sharing  Motor: Normal  Speech/Language:  Clear & coherent-speaking fast  Affect: Appropriate  Mood: sad  Thought process: normal  Thought content:   WNL  Sensory/Perceptual disturbances:   WNL  Orientation: oriented to person, place, and time/date  Attention: Good  Concentration: Good  Memory: WNL  Fund of knowledge:  Good  Insight:   Good  Judgment:  Good  Impulse Control: Good   Risk Assessment: Danger to Self:  No Self-injurious Behavior: No Danger to Others: No Duty to Warn:no Physical Aggression / Violence:No  Access to Firearms a concern: No  Gang Involvement:No   Subjective: Pt started to cry early in session due to grief over the death of her Dad. She visited [place] last wknd w/Family gathered for her 50th Wallie Renshaw coming up. This gathering caused her to express much emotion once she returned home this past Tue.  Pt is starting to exer @ the Gym w/her Son regularly. She has lost some weight & intent on cont'g this trend to feel healthier.   Interventions: Solution-Oriented/Positive Psychology  Diagnosis:Adjustment disorder with mixed anxiety and depressed mood  Grief reaction  Plan: ST: Cont to exer w/your Son! Give your grief time-it does not go away bc we ignore it. Depend on your Friend Support Grp & explore a Grp Support in the Community that might also offer further processing of your feelings w/others-to your comfort level. Do this over the next mos.  LT: Determine if the Support Grp is helping over the next 2-3 mos & cont or  discont' as needed.     Donnetta Hutching, LMFT

## 2021-11-18 ENCOUNTER — Ambulatory Visit: Payer: 59 | Admitting: Behavioral Health

## 2021-11-18 ENCOUNTER — Encounter: Payer: Self-pay | Admitting: *Deleted

## 2021-12-10 ENCOUNTER — Ambulatory Visit: Payer: 59 | Admitting: Physician Assistant

## 2021-12-11 ENCOUNTER — Other Ambulatory Visit: Payer: Self-pay | Admitting: Obstetrics and Gynecology

## 2021-12-11 DIAGNOSIS — Z1231 Encounter for screening mammogram for malignant neoplasm of breast: Secondary | ICD-10-CM

## 2021-12-23 ENCOUNTER — Encounter: Payer: Self-pay | Admitting: Physician Assistant

## 2021-12-23 ENCOUNTER — Ambulatory Visit: Payer: 59 | Admitting: Physician Assistant

## 2021-12-23 VITALS — BP 130/84 | HR 81 | Temp 97.5°F | Ht 68.0 in | Wt 191.4 lb

## 2021-12-23 DIAGNOSIS — Z23 Encounter for immunization: Secondary | ICD-10-CM

## 2021-12-23 DIAGNOSIS — F419 Anxiety disorder, unspecified: Secondary | ICD-10-CM | POA: Diagnosis not present

## 2021-12-23 DIAGNOSIS — R030 Elevated blood-pressure reading, without diagnosis of hypertension: Secondary | ICD-10-CM | POA: Diagnosis not present

## 2021-12-23 MED ORDER — ALPRAZOLAM 0.5 MG PO TABS: 0.5000 mg | ORAL_TABLET | ORAL | 0 refills | Status: DC | PRN

## 2021-12-23 NOTE — Progress Notes (Signed)
Barbara Cardenas is a 51 y.o. female here for a follow up of a pre-existing problem.  History of Present Illness:   Chief Complaint  Patient presents with   f/u  elevated blood pressure    Pt has been checking blood pressure , 133/90 yesterday.   Anxiety    HPI  Hypertension Patient is compliant with her 5 mg Norvasc daily. She reports that she does not regularly check blood pressure at home. BP Readings from Last 3 Encounters:  12/23/21 130/84  09/09/21 (!) 150/100  08/21/21 (!) 146/97   Anxiety Patient is compliant with her 0.5 mg Xanax medication prn. She reports that she is experiencing reduced stress and she is more relaxed. 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 She has not taken hydroxyzine for sleep. She does continue to have times where she wakes up in the middle of the night.   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   GERD (gastroesophageal reflux disease)    Headache    Hypercholesteremia 12/21/2013   Mediastinal lymphadenopathy    Neuropathy    Pneumonia    Postoperative hypothyroidism 09/07/2015   Seasonal allergies 07/20/2016   Thyroid cancer ([place])    Vision abnormalities      Social History   Tobacco Use   Smoking status: Never   Smokeless tobacco: Never  Vaping Use   Vaping Use: Never used  Substance Use Topics   Alcohol use: Yes    Comment: rare glass of wine   Drug use: No    Past Surgical History:  Procedure Laterality Date   DILATION AND CURETTAGE OF UTERUS     THYROIDECTOMY, PARTIAL     full   VIDEO BRONCHOSCOPY WITH ENDOBRONCHIAL ULTRASOUND N/A 09/08/2018   Procedure: VIDEO BRONCHOSCOPY WITH ENDOBRONCHIAL ULTRASOUND AND BIOPSIES;  Surgeon: Collene Gobble, MD;  Location: [facility] OR;  Service: Thoracic;  Laterality: N/A;    Family History  Problem Relation Age of Onset   Healthy  Mother    CAD Father    Allergies Father    Ulcerative colitis Father    Tremor Father    Neuropathy Neg Hx     Allergies  Allergen Reactions   Latex Hives   Pseudoephedrine-Guaifenesin Er Other (See Comments)    Current Medications:   Current Outpatient Medications:    acetaminophen (TYLENOL) 500 MG tablet, Take 1,000 mg by mouth daily as needed for moderate pain or headache., Disp: , Rfl:    albuterol (VENTOLIN HFA) 108 (90 Base) MCG/ACT inhaler, Inhale 2 puffs into the lungs every 6 (six) hours as needed for wheezing or shortness of breath. , Disp: , Rfl:    amLODipine (NORVASC) 5 MG tablet, Take 1 tablet (5 mg total) by mouth daily., Disp: 90 tablet, Rfl: 1   diphenhydrAMINE (BENADRYL) 25 MG tablet, Take 25 mg by mouth daily as needed for allergies., Disp: , Rfl:    fexofenadine (ALLEGRA) 180 MG tablet, Take 180 mg by mouth every evening., Disp: , Rfl:    ibuprofen (ADVIL) 200 MG tablet, Take 400 mg by mouth daily as needed for headache or moderate pain., Disp: , Rfl:    levothyroxine (SYNTHROID) 100 MCG tablet, Take 1 tablet (100 mcg total) by mouth daily before breakfast., Disp: 90 tablet, Rfl: 0   Multiple Vitamin (MULTIVITAMIN WITH MINERALS) TABS tablet, Take  1 tablet by mouth 3 (three) times a week. , Disp: , Rfl:    nitroGLYCERIN (NITRODUR - DOSED IN MG/24 HR) 0.2 mg/hr patch, APPLY 1/4 PATCH DAILY TO TENDON FOR TENDONITIS., Disp: 30 patch, Rfl: 1   Olopatadine HCl 0.2 % SOLN, Place 1-2 drops into both eyes daily as needed (allergies). , Disp: , Rfl:    Olopatadine HCl 0.6 % SOLN, Place 2 sprays into both nostrils 2 (two) times daily., Disp: , Rfl:    ALPRAZolam (XANAX) 0.5 MG tablet, Take 1 tablet (0.5 mg total) by mouth as needed (flying)., Disp: 30 tablet, Rfl: 0   hydrOXYzine (ATARAX) 25 MG tablet, TAKE 0.5-1 TABLET NIGHTLY AS NEEDED FOR SLEEP. (Patient not taking: Reported on 12/23/2021), Disp: 90 tablet, Rfl: 1   Review of Systems:   ROS Negative unless otherwise  specified per HPI.  Vitals:   Vitals:   12/23/21 1514  BP: 130/84  Pulse: 81  Temp: (!) 97.5 F (36.4 C)  TempSrc: Temporal  SpO2: 97%  Weight: 191 lb 6.1 oz (86.8 kg)  Height: '5\' 8"'$  (1.727 m)     Body mass index is 29.1 kg/m.  Physical Exam:   Physical Exam Constitutional:      General: She is not in acute distress.    Appearance: Normal appearance. She is not ill-appearing.  HENT:     Head: Normocephalic and atraumatic.     Right Ear: External ear normal.     Left Ear: External ear normal.  Eyes:     Extraocular Movements: Extraocular movements intact.     Pupils: Pupils are equal, round, and reactive to light.  Cardiovascular:     Rate and Rhythm: Normal rate and regular rhythm.     Heart sounds: Normal heart sounds. No murmur heard.    No gallop.  Pulmonary:     Effort: Pulmonary effort is normal. No respiratory distress.     Breath sounds: Normal breath sounds. No wheezing or rales.  Skin:    General: Skin is warm and dry.  Neurological:     Mental Status: She is alert and oriented to person, place, and time.  Psychiatric:        Judgment: Judgment normal.     Assessment and Plan:   Elevated blood pressure reading Well controlled Continue amlodopine 5 mg daily Follow-up in 6 months, sooner if concerns  Anxiety Well controlled May take 25 mg hydroxyzine for sleep if needed Refill xanax for prn use PDMP reviewed, no red flags  Need for immunization against influenza Completed today  I,Verona Buck,acting as a scribe for [employer], PA.,have documented all relevant documentation on the behalf of Barbara Coke, PA,as directed by  Barbara Coke, PA while in the presence of Barbara Cardenas, [place].  I, Barbara Cardenas, [place], have reviewed all documentation for this visit. The documentation on 12/23/21 for the exam, diagnosis, procedures, and orders are all accurate and complete.   Barbara Coke, PA-C

## 2021-12-23 NOTE — Patient Instructions (Signed)
It was great to see you!  Continue amlodipine 5 mg   Trial hydroxyzine 25 mg nightly - message if makes you too sleepy or if ineffective  Take care,  [name] PA-C

## 2022-01-28 ENCOUNTER — Ambulatory Visit
Admission: RE | Admit: 2022-01-28 | Discharge: 2022-01-28 | Disposition: A | Discharge status: Home / Self Care | Payer: 59 | Source: Ambulatory Visit | Attending: Obstetrics and Gynecology | Admitting: Obstetrics and Gynecology

## 2022-01-28 DIAGNOSIS — Z1231 Encounter for screening mammogram for malignant neoplasm of breast: Secondary | ICD-10-CM

## 2022-03-09 ENCOUNTER — Other Ambulatory Visit: Payer: Self-pay | Admitting: Physician Assistant

## 2022-06-23 NOTE — Progress Notes (Signed)
Barbara Cardenas is a 52 y.o. female here for a follow up of a pre-existing problem.  History of Present Illness:   No chief complaint on file.   HPI  GERD: She complains of heartburn since ***.   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   GERD (gastroesophageal reflux disease)    Headache    Hypercholesteremia 12/21/2013   Mediastinal lymphadenopathy    Neuropathy    Pneumonia    Postoperative hypothyroidism 09/07/2015   Seasonal allergies 07/20/2016   Thyroid cancer ([facility])    Vision abnormalities      Social History   Tobacco Use   Smoking status: Never   Smokeless tobacco: Never  Vaping Use   Vaping Use: Never used  Substance Use Topics   Alcohol use: Yes    Comment: rare glass of wine   Drug use: No    Past Surgical History:  Procedure Laterality Date   DILATION AND CURETTAGE OF UTERUS     THYROIDECTOMY, PARTIAL     full   VIDEO BRONCHOSCOPY WITH ENDOBRONCHIAL ULTRASOUND N/A 09/08/2018   Procedure: VIDEO BRONCHOSCOPY WITH ENDOBRONCHIAL ULTRASOUND AND BIOPSIES;  Surgeon: Leslye Peer, MD;  Location: [facility] OR;  Service: Thoracic;  Laterality: N/A;    Family History  Problem Relation Age of Onset   Healthy Mother    CAD Father    Allergies Father    Ulcerative colitis Father    Tremor Father    Neuropathy Neg Hx     Allergies  Allergen Reactions   Latex Hives   Pseudoephedrine-Guaifenesin Er Other (See Comments)    Current Medications:   Current Outpatient Medications:    acetaminophen (TYLENOL) 500 MG tablet, Take 1,000 mg by mouth daily as needed for moderate pain or headache., Disp: , Rfl:    albuterol (VENTOLIN HFA) 108 (90 Base) MCG/ACT inhaler, Inhale 2 puffs into the lungs every 6 (six) hours as needed for wheezing or shortness of breath. , Disp: , Rfl:    ALPRAZolam (XANAX) 0.5 MG tablet, Take 1 tablet (0.5 mg total) by mouth as needed  (flying)., Disp: 30 tablet, Rfl: 0   amLODipine (NORVASC) 5 MG tablet, TAKE 1 TABLET (5 MG TOTAL) BY MOUTH DAILY., Disp: 90 tablet, Rfl: 1   diphenhydrAMINE (BENADRYL) 25 MG tablet, Take 25 mg by mouth daily as needed for allergies., Disp: , Rfl:    fexofenadine (ALLEGRA) 180 MG tablet, Take 180 mg by mouth every evening., Disp: , Rfl:    hydrOXYzine (ATARAX) 25 MG tablet, TAKE 0.5-1 TABLET NIGHTLY AS NEEDED FOR SLEEP. (Patient not taking: Reported on 12/23/2021), Disp: 90 tablet, Rfl: 1   ibuprofen (ADVIL) 200 MG tablet, Take 400 mg by mouth daily as needed for headache or moderate pain., Disp: , Rfl:    levothyroxine (SYNTHROID) 100 MCG tablet, Take 1 tablet (100 mcg total) by mouth daily before breakfast., Disp: 90 tablet, Rfl: 0   Multiple Vitamin (MULTIVITAMIN WITH MINERALS) TABS tablet, Take 1 tablet by mouth 3 (three) times a week. , Disp: , Rfl:    nitroGLYCERIN (NITRODUR - DOSED IN MG/24 HR) 0.2 mg/hr patch, APPLY 1/4 PATCH DAILY TO TENDON FOR TENDONITIS., Disp: 30 patch, Rfl: 1   Olopatadine HCl 0.2 % SOLN, Place 1-2 drops into both eyes daily as needed (allergies). , Disp: , Rfl:    Olopatadine HCl 0.6 % SOLN, Place 2 sprays into  both nostrils 2 (two) times daily., Disp: , Rfl:    Review of Systems:   ROS  Vitals:   There were no vitals filed for this visit.   There is no height or weight on file to calculate BMI.  Physical Exam:   Physical Exam  Assessment and Plan:   There are no diagnoses linked to this encounter.  I,Rachel Rivera,acting as a Neurosurgeon for [employer], PA.,have documented all relevant documentation on the behalf of Jarold Motto, PA,as directed by  Jarold Motto, PA while in the presence of Jarold Motto, [place].  ***  Jarold Motto, PA-C

## 2022-06-24 ENCOUNTER — Encounter: Payer: Self-pay | Admitting: Physician Assistant

## 2022-06-24 ENCOUNTER — Ambulatory Visit: Payer: 59 | Admitting: Physician Assistant

## 2022-06-24 VITALS — BP 138/80 | HR 70 | Temp 97.1°F | Ht 68.0 in | Wt 188.0 lb

## 2022-06-24 DIAGNOSIS — K219 Gastro-esophageal reflux disease without esophagitis: Secondary | ICD-10-CM

## 2022-06-24 DIAGNOSIS — J329 Chronic sinusitis, unspecified: Secondary | ICD-10-CM

## 2022-06-24 LAB — CBC WITH DIFFERENTIAL/PLATELET
Basophils Absolute: 0 10*3/uL (ref 0.0–0.1)
Basophils Relative: 0.3 % (ref 0.0–3.0)
Eosinophils Absolute: 0.1 10*3/uL (ref 0.0–0.7)
Eosinophils Relative: 1.7 % (ref 0.0–5.0)
HCT: 40.3 % (ref 36.0–46.0)
Hemoglobin: 13.7 g/dL (ref 12.0–15.0)
Lymphocytes Relative: 21.7 % (ref 12.0–46.0)
Lymphs Abs: 1.4 10*3/uL (ref 0.7–4.0)
MCHC: 34 g/dL (ref 30.0–36.0)
MCV: 85.7 fl (ref 78.0–100.0)
Monocytes Absolute: 0.5 10*3/uL (ref 0.1–1.0)
Monocytes Relative: 7.7 % (ref 3.0–12.0)
Neutro Abs: 4.5 10*3/uL (ref 1.4–7.7)
Neutrophils Relative %: 68.6 % (ref 43.0–77.0)
Platelets: 271 10*3/uL (ref 150.0–400.0)
RBC: 4.71 Mil/uL (ref 3.87–5.11)
RDW: 13.9 % (ref 11.5–15.5)
WBC: 6.6 10*3/uL (ref 4.0–10.5)

## 2022-06-24 LAB — COMPREHENSIVE METABOLIC PANEL
ALT: 14 U/L (ref 0–35)
AST: 16 U/L (ref 0–37)
Albumin: 4.7 g/dL (ref 3.5–5.2)
Alkaline Phosphatase: 92 U/L (ref 39–117)
BUN: 15 mg/dL (ref 6–23)
CO2: 31 mEq/L (ref 19–32)
Calcium: 9.8 mg/dL (ref 8.4–10.5)
Chloride: 103 mEq/L (ref 96–112)
Creatinine, Ser: 0.72 mg/dL (ref 0.40–1.20)
GFR: 97.35 mL/min (ref >60.00)
Glucose, Bld: 84 mg/dL (ref 70–99)
Potassium: 4.1 mEq/L (ref 3.5–5.1)
Sodium: 142 mEq/L (ref 135–145)
Total Bilirubin: 0.7 mg/dL (ref 0.2–1.2)
Total Protein: 7.4 g/dL (ref 6.0–8.3)

## 2022-06-24 LAB — LIPASE: Lipase: 54 U/L (ref 11.0–59.0)

## 2022-06-24 LAB — H. PYLORI ANTIBODY, IGG: H Pylori IgG: NEGATIVE

## 2022-06-24 MED ORDER — SUCRALFATE 1 G PO TABS: 1.0000 g | ORAL_TABLET | Freq: Three times a day (TID) | ORAL | 0 refills | Status: DC

## 2022-06-24 MED ORDER — PANTOPRAZOLE SODIUM 40 MG PO TBEC: 40.0000 mg | DELAYED_RELEASE_TABLET | Freq: Every day | ORAL | 0 refills | Status: DC

## 2022-06-24 NOTE — Patient Instructions (Signed)
It was great to see you!  ENT -  [facility] -- referral placed   For your stomach: Stop nexium (purple pill) Change to protonix (sent in) Start carafate four times daily before meals and bedtime *Try to take an hour apart from your thyroid medication  We will update blood work   If any WORSENING symptoms or no improvement, we will obtain CT scan of your abdomen   Take care,  Jarold Motto PA-C

## 2022-06-25 ENCOUNTER — Ambulatory Visit: Payer: 59 | Admitting: Physician Assistant

## 2022-06-26 ENCOUNTER — Encounter: Payer: Self-pay | Admitting: Physician Assistant

## 2022-06-26 ENCOUNTER — Ambulatory Visit (INDEPENDENT_AMBULATORY_CARE_PROVIDER_SITE_OTHER): Payer: 59 | Admitting: Physician Assistant

## 2022-06-26 VITALS — BP 130/80 | HR 83 | Temp 97.3°F | Ht 68.0 in | Wt 190.0 lb

## 2022-06-26 DIAGNOSIS — L03213 Periorbital cellulitis: Secondary | ICD-10-CM

## 2022-06-26 MED ORDER — PREDNISONE 20 MG PO TABS: 20.0000 mg | ORAL_TABLET | Freq: Two times a day (BID) | ORAL | 0 refills | Status: DC

## 2022-06-26 NOTE — Progress Notes (Signed)
Barbara Cardenas is a 52 y.o. female here for a new problem.  History of Present Illness:   Chief Complaint  Patient presents with   Eye Problem    Pt c/o left eye tender to touch, puffy, left side of face painful.    HPI  Peri-orbital cellulitis She complains of swelling, eye pain,  tenderness to touch, numbness, and tingling in left side of face since Sunday She presented to UC yesterday and was prescribed Augmentin for 7 days and was diagnosed with periorbital cellulitis She notes that her symptoms were worse this morning than yesterday.  She notes she tried a new eye liner last week.  She did a warm compress last night.  Currently takes Claritin for allergies.  She has a rescue inhaler but has not needed to use it in a while.  She has a hx of bells palsy from 2022 that resolved.   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   GERD (gastroesophageal reflux disease)    Headache    Hypercholesteremia 12/21/2013   Mediastinal lymphadenopathy    Neuropathy    Pneumonia    Postoperative hypothyroidism 09/07/2015   Seasonal allergies 07/20/2016   Thyroid cancer (HCC)    Vision abnormalities      Social History   Tobacco Use   Smoking status: Never   Smokeless tobacco: Never  Vaping Use   Vaping Use: Never used  Substance Use Topics   Alcohol use: Yes    Comment: rare glass of wine   Drug use: No    Past Surgical History:  Procedure Laterality Date   DILATION AND CURETTAGE OF UTERUS     THYROIDECTOMY, PARTIAL     full   VIDEO BRONCHOSCOPY WITH ENDOBRONCHIAL ULTRASOUND N/A 09/08/2018   Procedure: VIDEO BRONCHOSCOPY WITH ENDOBRONCHIAL ULTRASOUND AND BIOPSIES;  Surgeon: Leslye Peer, MD;  Location: [facility] OR;  Service: Thoracic;  Laterality: N/A;    Family History  Problem Relation  Age of Onset   Healthy Mother    CAD Father    Allergies Father    Ulcerative colitis Father    Tremor Father    Neuropathy Neg Hx     Allergies  Allergen Reactions   Latex Hives   Pseudoephedrine-Guaifenesin Er Other (See Comments)    Current Medications:   Current Outpatient Medications:    acetaminophen (TYLENOL) 500 MG tablet, Take 1,000 mg by mouth daily as needed for moderate pain or headache., Disp: , Rfl:    albuterol (VENTOLIN HFA) 108 (90 Base) MCG/ACT inhaler, Inhale 2 puffs into the lungs every 6 (six) hours as needed for wheezing or shortness of breath. , Disp: , Rfl:    ALPRAZolam (XANAX) 0.5 MG tablet, Take 1 tablet (0.5 mg total) by mouth as needed (flying)., Disp: 30 tablet, Rfl: 0   amLODipine (NORVASC) 5 MG tablet, TAKE 1 TABLET (5 MG TOTAL) BY MOUTH DAILY., Disp: 90 tablet, Rfl: 1   amoxicillin-clavulanate (AUGMENTIN) 875-125 MG tablet, Take 1 tablet by mouth 2 (two) times daily., Disp: , Rfl:    diphenhydrAMINE (BENADRYL) 25 MG tablet, Take 25 mg by mouth daily as needed for allergies., Disp: , Rfl:    hydrOXYzine (ATARAX) 25 MG tablet, TAKE 0.5-1 TABLET NIGHTLY AS NEEDED FOR SLEEP., Disp: 90 tablet, Rfl: 1   ibuprofen (ADVIL) 200  MG tablet, Take 400 mg by mouth daily as needed for headache or moderate pain., Disp: , Rfl:    levothyroxine (SYNTHROID) 100 MCG tablet, Take 1 tablet (100 mcg total) by mouth daily before breakfast., Disp: 90 tablet, Rfl: 0   loratadine (CLARITIN) 10 MG tablet, Take 10 mg by mouth daily., Disp: , Rfl:    Multiple Vitamin (MULTIVITAMIN WITH MINERALS) TABS tablet, Take 1 tablet by mouth 3 (three) times a week. , Disp: , Rfl:    nitroGLYCERIN (NITRODUR - DOSED IN MG/24 HR) 0.2 mg/hr patch, APPLY 1/4 PATCH DAILY TO TENDON FOR TENDONITIS., Disp: 30 patch, Rfl: 1   Olopatadine HCl 0.2 % SOLN, Place 1-2 drops into both eyes daily as needed (allergies). , Disp: , Rfl:    Olopatadine HCl 0.6 % SOLN, Place 2 sprays into both nostrils 2 (two)  times daily., Disp: , Rfl:    pantoprazole (PROTONIX) 40 MG tablet, Take 1 tablet (40 mg total) by mouth daily., Disp: 30 tablet, Rfl: 0   sucralfate (CARAFATE) 1 g tablet, Take 1 tablet (1 g total) by mouth 4 (four) times daily -  with meals and at bedtime., Disp: 28 tablet, Rfl: 0   Review of Systems:   Review of Systems  Eyes:  Positive for pain (left).    Vitals:   Vitals:   06/26/22 1040  BP: 130/80  Pulse: 83  Temp: (!) 97.3 F (36.3 C)  TempSrc: Temporal  SpO2: 97%  Weight: 190 lb (86.2 kg)  Height: 5\' 8"  (1.727 m)     Body mass index is 28.89 kg/m.  Physical Exam:   Physical Exam Vitals and nursing note reviewed.  Constitutional:      General: She is not in acute distress.    Appearance: She is well-developed. She is not ill-appearing or toxic-appearing.  HENT:     Head: Normocephalic and atraumatic.     Right Ear: Tympanic membrane, ear canal and external ear normal. Tympanic membrane is not erythematous, retracted or bulging.     Left Ear: Tympanic membrane, ear canal and external ear normal. Tympanic membrane is not erythematous, retracted or bulging.     Nose: Nose normal.     Right Sinus: No maxillary sinus tenderness or frontal sinus tenderness.     Left Sinus: No maxillary sinus tenderness or frontal sinus tenderness.     Comments: Slight erythema and swelling to upper maxillary region on left side of face No significant tenderness to palpation of this area    Mouth/Throat:     Pharynx: Uvula midline. No posterior oropharyngeal erythema.  Eyes:     General: Lids are normal.     Conjunctiva/sclera: Conjunctivae normal.  Neck:     Trachea: Trachea normal.  Cardiovascular:     Rate and Rhythm: Normal rate and regular rhythm.     Heart sounds: Normal heart sounds, S1 normal and S2 normal.  Pulmonary:     Effort: Pulmonary effort is normal.     Breath sounds: Normal breath sounds. No decreased breath sounds, wheezing, rhonchi or rales.   Lymphadenopathy:     Cervical: No cervical adenopathy.  Skin:    General: Skin is warm and dry.  Neurological:     General: No focal deficit present.     Mental Status: She is alert.     Cranial Nerves: Facial asymmetry (slight droop to left side of mouth when face relaxed) present.     Sensory: Sensation is intact.     Motor: Motor function  is intact.     Coordination: Coordination is intact.     Comments: Bilateral eyebrows raise without difficulty or asymmetry Smile is symmetrical  Psychiatric:        Speech: Speech normal.        Behavior: Behavior normal. Behavior is cooperative.     Assessment and Plan:   Periorbital cellulitis of left eye No red flags on exam Endorses slight improvement at this moment compared to yesterday Recommend continued Augmentin for possible underlying infection and start oral prednisone to help with inflammation and swelling Neuroexam was overall normal except for slight asymmetry in her mouth at rest however suspect that this is due to swelling in her cheek and not due to underlying neurological deficit as the rest of her neurological exam is normal Supervising physician, Dr. Jacquiline Doe, was also in to evaluate patient  ER precautions advised if anything worsens   I,Rachel Rivera,acting as a scribe for Jarold Motto, PA.,have documented all relevant documentation on the behalf of Jarold Motto, PA,as directed by  Jarold Motto, PA while in the presence of Jarold Motto, [place].  I, Jarold Motto, [place], have reviewed all documentation for this visit. The documentation on 06/26/22 for the exam, diagnosis, procedures, and orders are all accurate and complete.  I spent a total of 30 minutes on this visit, today 06/26/22, which included reviewing previous notes from Urgent Care on 06/25/22, ordering tests, discussing plan of care with patient and using shared-decision making on next steps, ordering medications, reviewing plan of care with  supervising physician and documenting the findings in the note.  Jarold Motto, PA-C

## 2022-06-26 NOTE — Patient Instructions (Signed)
It was great to see you!  Start prednisone 20 mg today Start 40 mg prednisone tomorrow AM  Avoid all ibuprofen while taking prednisone  Headache Precautions: Contact a doctor if: Your symptoms are not helped by medicine. You have a headache that feels different than the other headaches. You feel sick to your stomach (nauseous) or you throw up (vomit). You have a fever. Get help right away if: Your headache gets very bad quickly. Your headache gets worse after a lot of physical activity. You keep throwing up. You have a stiff neck. You have trouble seeing. You have trouble speaking. You have pain in the eye or ear. Your muscles are weak or you lose muscle control. You lose your balance or have trouble walking. You feel like you will pass out (faint) or you pass out. You are mixed up (confused). You have a seizure.   Take care,  Jarold Motto PA-C

## 2022-06-27 ENCOUNTER — Ambulatory Visit: Payer: 59 | Admitting: Physician Assistant

## 2022-07-07 ENCOUNTER — Other Ambulatory Visit: Payer: Self-pay | Admitting: Physician Assistant

## 2022-07-07 DIAGNOSIS — F419 Anxiety disorder, unspecified: Secondary | ICD-10-CM

## 2022-07-08 NOTE — Telephone Encounter (Signed)
Pt requesting refill for Alprazolam 0.5 mg. Last OV 06/26/2022

## 2022-07-16 ENCOUNTER — Other Ambulatory Visit: Payer: Self-pay | Admitting: Physician Assistant

## 2022-08-13 ENCOUNTER — Other Ambulatory Visit: Payer: Self-pay | Admitting: Physician Assistant

## 2022-09-17 ENCOUNTER — Ambulatory Visit: Payer: 59 | Admitting: Family Medicine

## 2022-09-17 ENCOUNTER — Encounter: Payer: Self-pay | Admitting: Family Medicine

## 2022-09-17 VITALS — BP 127/86 | HR 77 | Temp 98.2°F | Ht 68.0 in | Wt 189.8 lb

## 2022-09-17 DIAGNOSIS — H109 Unspecified conjunctivitis: Secondary | ICD-10-CM | POA: Diagnosis not present

## 2022-09-17 MED ORDER — POLYMYXIN B-TRIMETHOPRIM 10000-0.1 UNIT/ML-% OP SOLN: 2.0000 [drp] | OPHTHALMIC | 0 refills | Status: DC

## 2022-09-17 NOTE — Patient Instructions (Signed)
It was very nice to see you today!  You probably have viral conjunctivitis.  Please continue with your eyedrop.  I will send in Polytrim drops to prevent bacterial infection as well.  Let us know if not improving.  Return if symptoms worsen or fail to improve.   Take care, Dr Jimmey Ralph  PLEASE NOTE:  If you had any lab tests, please let us know if you have not heard back within a few days. You may see your results on mychart before we have a chance to review them but we will give you a call once they are reviewed by [place].   If we ordered any referrals today, please let us know if you have not heard from their office within the next week.   If you had any urgent prescriptions sent in today, please check with the pharmacy within an hour of our visit to make sure the prescription was transmitted appropriately.   Please try these tips to maintain a healthy lifestyle:  Eat at least 3 REAL meals and 1-2 snacks per day.  Aim for no more than 5 hours between eating.  If you eat breakfast, please do so within one hour of getting up.   Each meal should contain half fruits/vegetables, one quarter protein, and one quarter carbs (no bigger than a computer mouse)  Cut down on sweet beverages. This includes juice, soda, and sweet tea.   Drink at least 1 glass of water with each meal and aim for at least 8 glasses per day  Exercise at least 150 minutes every week.

## 2022-09-17 NOTE — Progress Notes (Signed)
   Barbara Cardenas is a 52 y.o. female who presents today for an office visit.  Assessment/Plan:  Conjunctivitis No red flags.  Likely viral infection.  No signs of bacterial infection or systemic infection.  Recommended that she continue taking over-the-counter antihistamine eyedrops to help with symptoms.  She can also continue with her Claritin and Benadryl as well.  Discussed cool compresses.  Will send in Polytrim drops to prevent bacterial infection.  We did discuss warning signs and reasons to return to care.  Follow-up as needed.    Subjective:  HPI:  PAtient here with bilateral eye redness and irritation.  Symptoms started yesterday.  Son recently traveled to [place] and he has started having similar symptoms as well.  She did try using allergy drops yesterday which caused a burning sensation but does not seem like it helped much with her irritation.  She has had a lot of watery discharge.  She had to pry her eyes open this morning.  No fever or chills.  She feels a gritty sensation in her eyes.  She has noticed some difficulty with bright lights.  I has not felt more fatigued as well.  Difficult for her to focus on things.  No pain with eye movement.        Objective:  Physical Exam: BP 127/86   Pulse 77   Temp 98.2 F (36.8 C) (Temporal)   Ht 5\' 8"  (1.727 m)   Wt 189 lb 12.8 oz (86.1 kg)   LMP 02/26/2021   SpO2 100%   BMI 28.86 kg/m   Gen: No acute distress, resting comfortably HEENT: Bilateral conjunctival erythema.  Small amount of watery discharge noted.  Slight edema to left upper eyelid.  Extraocular eye movements intact without pain.  No purulent discharge. CV: Regular rate and rhythm with no murmurs appreciated Pulm: Normal work of breathing, clear to auscultation bilaterally with no crackles, wheezes, or rhonchi Neuro: Grossly normal, moves all extremities Psych: Normal affect and thought content      Barbara Cardenas M. Jimmey Ralph, MD 09/17/2022 10:43 AM

## 2022-11-03 ENCOUNTER — Ambulatory Visit: Payer: 59 | Admitting: Physician Assistant

## 2022-11-03 ENCOUNTER — Encounter: Payer: Self-pay | Admitting: Physician Assistant

## 2022-11-03 VITALS — BP 138/88 | HR 72 | Temp 97.6°F | Ht 68.0 in | Wt 186.6 lb

## 2022-11-03 DIAGNOSIS — Z85850 Personal history of malignant neoplasm of thyroid: Secondary | ICD-10-CM

## 2022-11-03 DIAGNOSIS — Z23 Encounter for immunization: Secondary | ICD-10-CM | POA: Diagnosis not present

## 2022-11-03 DIAGNOSIS — E7800 Pure hypercholesterolemia, unspecified: Secondary | ICD-10-CM

## 2022-11-03 DIAGNOSIS — E559 Vitamin D deficiency, unspecified: Secondary | ICD-10-CM

## 2022-11-03 DIAGNOSIS — M21611 Bunion of right foot: Secondary | ICD-10-CM

## 2022-11-03 DIAGNOSIS — M2550 Pain in unspecified joint: Secondary | ICD-10-CM

## 2022-11-03 DIAGNOSIS — E890 Postprocedural hypothyroidism: Secondary | ICD-10-CM

## 2022-11-03 DIAGNOSIS — R208 Other disturbances of skin sensation: Secondary | ICD-10-CM | POA: Diagnosis not present

## 2022-11-03 LAB — CBC WITH DIFFERENTIAL/PLATELET
Basophils Absolute: 0 10*3/uL (ref 0.0–0.1)
Basophils Relative: 0.5 % (ref 0.0–3.0)
Eosinophils Absolute: 0.1 10*3/uL (ref 0.0–0.7)
Eosinophils Relative: 2.3 % (ref 0.0–5.0)
HCT: 40.3 % (ref 36.0–46.0)
Hemoglobin: 12.9 g/dL (ref 12.0–15.0)
Lymphocytes Relative: 21 % (ref 12.0–46.0)
Lymphs Abs: 1 10*3/uL (ref 0.7–4.0)
MCHC: 32.1 g/dL (ref 30.0–36.0)
MCV: 87.9 fl (ref 78.0–100.0)
Monocytes Absolute: 0.3 10*3/uL (ref 0.1–1.0)
Monocytes Relative: 6.7 % (ref 3.0–12.0)
Neutro Abs: 3.4 10*3/uL (ref 1.4–7.7)
Neutrophils Relative %: 69.5 % (ref 43.0–77.0)
Platelets: 253 10*3/uL (ref 150.0–400.0)
RBC: 4.58 Mil/uL (ref 3.87–5.11)
RDW: 13.9 % (ref 11.5–15.5)
WBC: 4.9 10*3/uL (ref 4.0–10.5)

## 2022-11-03 LAB — LIPID PANEL
Cholesterol: 203 mg/dL — ABNORMAL HIGH (ref 0–200)
HDL: 66.2 mg/dL (ref >39.00)
LDL Cholesterol: 115 mg/dL — ABNORMAL HIGH (ref 0–99)
NonHDL: 136.93
Total CHOL/HDL Ratio: 3
Triglycerides: 108 mg/dL (ref 0.0–149.0)
VLDL: 21.6 mg/dL (ref 0.0–40.0)

## 2022-11-03 LAB — VITAMIN D 25 HYDROXY (VIT D DEFICIENCY, FRACTURES): VITD: 25.8 ng/mL — ABNORMAL LOW (ref 30.00–100.00)

## 2022-11-03 LAB — VITAMIN B12: Vitamin B-12: 456 pg/mL (ref 211–911)

## 2022-11-03 LAB — COMPREHENSIVE METABOLIC PANEL
ALT: 20 U/L (ref 0–35)
AST: 17 U/L (ref 0–37)
Albumin: 4.3 g/dL (ref 3.5–5.2)
Alkaline Phosphatase: 94 U/L (ref 39–117)
BUN: 10 mg/dL (ref 6–23)
CO2: 32 meq/L (ref 19–32)
Calcium: 9.5 mg/dL (ref 8.4–10.5)
Chloride: 105 meq/L (ref 96–112)
Creatinine, Ser: 0.7 mg/dL (ref 0.40–1.20)
GFR: 100.44 mL/min (ref >60.00)
Glucose, Bld: 85 mg/dL (ref 70–99)
Potassium: 3.9 meq/L (ref 3.5–5.1)
Sodium: 144 meq/L (ref 135–145)
Total Bilirubin: 0.5 mg/dL (ref 0.2–1.2)
Total Protein: 7 g/dL (ref 6.0–8.3)

## 2022-11-03 LAB — TSH: TSH: 1.39 u[IU]/mL (ref 0.35–5.50)

## 2022-11-03 MED ORDER — MELOXICAM 7.5 MG PO TABS
7.5000 mg | ORAL_TABLET | Freq: Every day | ORAL | 0 refills | Status: DC
Start: 2022-11-03 — End: 2023-12-25

## 2022-11-03 NOTE — Progress Notes (Signed)
Barbara Cardenas is a 52 y.o. female here for a new problem.  History of Present Illness:   Chief Complaint  Patient presents with   foot pain    Pain in both foot along with some burning for the past 2 months. Pap is scheduled for Nov   Shoulder Pain    HPI  Leg Pain: She reports experiencing pain in her foot that tends to waxes and wanes and tend to occur suddenly.  Her pain has been persisting for a couple of months but has been worsening. Her pain feels like a burning sensation that tends to occur on the sole of her foot.  The pain is more noticeable in the right foot more than the left. She also reports having a bunion on the right foot. She typically wears birkenstocks; she's unable to wear tennis shoes due to her pain.   She also reports experiencing accompanying back pain and joint pain. She's been taking either tylenol or ibuprofen in the evening.  She has been in a recent move and states that definitely contributing to her pain.  Other than that, she has not been exercising regularly.   Thyroid Cancer history and hypothyroidism She has an appointment scheduled in December with her [facility] provider. She is interested in seeing some one locally rather than following up with [facility].  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   GERD (gastroesophageal reflux disease)    Headache    Hypercholesteremia 12/21/2013   Mediastinal lymphadenopathy    Neuropathy    Pneumonia    Postoperative hypothyroidism 09/07/2015   Seasonal allergies 07/20/2016   Thyroid cancer (HCC)    Vision abnormalities      Social History   Tobacco Use   Smoking status: Never   Smokeless tobacco: Never  Vaping Use   Vaping status: Never Used  Substance Use Topics   Alcohol use: Yes    Comment: rare glass of wine   Drug use: No    Past  Surgical History:  Procedure Laterality Date   DILATION AND CURETTAGE OF UTERUS     THYROIDECTOMY, PARTIAL     full   VIDEO BRONCHOSCOPY WITH ENDOBRONCHIAL ULTRASOUND N/A 09/08/2018   Procedure: VIDEO BRONCHOSCOPY WITH ENDOBRONCHIAL ULTRASOUND AND BIOPSIES;  Surgeon: Leslye Peer, MD;  Location: [facility] OR;  Service: Thoracic;  Laterality: N/A;    Family History  Problem Relation Age of Onset   Healthy Mother    CAD Father    Allergies Father    Ulcerative colitis Father    Tremor Father    Neuropathy Neg Hx     Allergies  Allergen Reactions   Latex Hives   Pseudoephedrine-Guaifenesin Er Other (See Comments)    Current Medications:   Current Outpatient Medications:    acetaminophen (TYLENOL) 500 MG tablet, Take 1,000 mg by mouth daily as needed for moderate pain or headache., Disp: , Rfl:    albuterol (VENTOLIN HFA) 108 (90 Base) MCG/ACT inhaler, Inhale 2 puffs into the lungs every 6 (six) hours as needed for wheezing or shortness of breath. , Disp: , Rfl:    ALPRAZolam (XANAX) 0.5 MG tablet, TAKE 1 TABLET (0.5 MG TOTAL) BY MOUTH AS NEEDED (FLYING)., Disp: 30 tablet, Rfl: 1   amLODipine (NORVASC) 5 MG tablet, TAKE 1 TABLET (5 MG TOTAL) BY MOUTH DAILY.,  Disp: 90 tablet, Rfl: 1   diphenhydrAMINE (BENADRYL) 25 MG tablet, Take 25 mg by mouth daily as needed for allergies., Disp: , Rfl:    hydrOXYzine (ATARAX) 25 MG tablet, TAKE 0.5-1 TABLET NIGHTLY AS NEEDED FOR SLEEP., Disp: 90 tablet, Rfl: 1   ibuprofen (ADVIL) 200 MG tablet, Take 400 mg by mouth daily as needed for headache or moderate pain., Disp: , Rfl:    levothyroxine (SYNTHROID) 100 MCG tablet, Take 1 tablet (100 mcg total) by mouth daily before breakfast., Disp: 90 tablet, Rfl: 0   loratadine (CLARITIN) 10 MG tablet, Take 10 mg by mouth daily., Disp: , Rfl:    meloxicam (MOBIC) 7.5 MG tablet, Take 1 tablet (7.5 mg total) by mouth daily., Disp: 30 tablet, Rfl: 0   Multiple Vitamin (MULTIVITAMIN WITH MINERALS) TABS tablet, Take  1 tablet by mouth 3 (three) times a week. , Disp: , Rfl:    nitroGLYCERIN (NITRODUR - DOSED IN MG/24 HR) 0.2 mg/hr patch, APPLY 1/4 PATCH DAILY TO TENDON FOR TENDONITIS., Disp: 30 patch, Rfl: 1   Olopatadine HCl 0.2 % SOLN, Place 1-2 drops into both eyes daily as needed (allergies). , Disp: , Rfl:    Olopatadine HCl 0.6 % SOLN, Place 2 sprays into both nostrils 2 (two) times daily., Disp: , Rfl:    pantoprazole (PROTONIX) 40 MG tablet, TAKE 1 TABLET BY MOUTH EVERY DAY, Disp: 90 tablet, Rfl: 1   sucralfate (CARAFATE) 1 g tablet, Take 1 tablet (1 g total) by mouth 4 (four) times daily -  with meals and at bedtime., Disp: 28 tablet, Rfl: 0   trimethoprim-polymyxin b (POLYTRIM) ophthalmic solution, Place 2 drops into both eyes every 4 (four) hours., Disp: 10 mL, Rfl: 0   Review of Systems:   Review of Systems  Musculoskeletal:  Positive for back pain.       +Leg pain    Vitals:   Vitals:   11/03/22 0831  BP: 138/88  Pulse: 72  Temp: 97.6 F (36.4 C)  SpO2: 98%  Weight: 186 lb 9.6 oz (84.6 kg)  Height: 5\' 8"  (1.727 m)     Body mass index is 28.37 kg/m.  Physical Exam:   Physical Exam Vitals and nursing note reviewed.  Constitutional:      General: She is not in acute distress.    Appearance: She is well-developed. She is not ill-appearing or toxic-appearing.  Cardiovascular:     Rate and Rhythm: Normal rate and regular rhythm.     Pulses: Normal pulses.     Heart sounds: Normal heart sounds, S1 normal and S2 normal.  Pulmonary:     Effort: Pulmonary effort is normal.     Breath sounds: Normal breath sounds.  Musculoskeletal:     Comments: No decreased ROM 2/2 pain with flexion/extension, lateral side bends, or rotation. Reproducible tenderness with deep palpation to bilateral lumbar paraspinal muscles. No bony tenderness. No evidence of erythema, rash or ecchymosis. Negative STLR bilaterally.   Feet:     Comments: No significant tenderness to palpation to b/l feet Normal  sensation Skin:    General: Skin is warm and dry.  Neurological:     Mental Status: She is alert.     GCS: GCS eye subscore is 4. GCS verbal subscore is 5. GCS motor subscore is 6.  Psychiatric:        Speech: Speech normal.        Behavior: Behavior normal. Behavior is cooperative.     Assessment and Plan:  Burning sensation of feet No red flags on exam Possibly related to low back pain Will update blood work and refer to podiatry Work on getting back into regular exercise Avoid excessive lifting with back Trial as needed mobic 7.5 mg If no improvement, will refer to sports medication(s) vs obtain imaging vs physical therapy   Arthralgia Generalized pain Suspect osteoarthritis Recommend getting back into regular exercise Trial as needed mobic 7.5 mg If no improvement, will refer to sports med  Bunion of great toe of right foot Podiatry referral  History of thyroid cancer; Postoperative hypothyroidism Will submit new referral to endo locally  Vitamin D deficiency Update vitamin D  Hypercholesteremia Update lipid panel today per patient request  Encounter for immunization Shingles shot and flu shot provided today  I,Safa M Kadhim,acting as a scribe for [employer], PA.,have documented all relevant documentation on the behalf of Jarold Motto, PA,as directed by  Jarold Motto, PA while in the presence of Jarold Motto, [place].   I, Jarold Motto, [place], have reviewed all documentation for this visit. The documentation on 11/03/22 for the exam, diagnosis, procedures, and orders are all accurate and complete.   Jarold Motto, PA-C

## 2022-11-03 NOTE — Patient Instructions (Addendum)
It was great to see you!  Will place new referral for [facility] and podiatry  Trial mobic 7.5 mg daily for severe pain -- this lasts all day do not take additional ibuprofen with this  If you have any new/worsening symptom(s), please reach out  If symptoms do not resolve, let me know and we will likely obtain imaging of your back and send to sports medicine vs physical therapy   Take care,  Jarold Motto PA-C

## 2022-12-25 ENCOUNTER — Other Ambulatory Visit: Payer: Self-pay | Admitting: Obstetrics and Gynecology

## 2022-12-25 DIAGNOSIS — Z1231 Encounter for screening mammogram for malignant neoplasm of breast: Secondary | ICD-10-CM

## 2023-01-07 ENCOUNTER — Ambulatory Visit (INDEPENDENT_AMBULATORY_CARE_PROVIDER_SITE_OTHER): Payer: 59 | Admitting: *Deleted

## 2023-01-07 DIAGNOSIS — Z23 Encounter for immunization: Secondary | ICD-10-CM

## 2023-01-07 NOTE — Progress Notes (Signed)
Per orders of Jarold Motto, [place], injection of 2nd Shingles Vaccine given in right deltoid per patient preference by Jobe Gibbon, CMA. Patient tolerated injection well.

## 2023-01-30 ENCOUNTER — Ambulatory Visit: Payer: 59

## 2023-01-30 ENCOUNTER — Other Ambulatory Visit: Payer: Self-pay | Admitting: Physician Assistant

## 2023-01-30 DIAGNOSIS — F419 Anxiety disorder, unspecified: Secondary | ICD-10-CM

## 2023-02-03 ENCOUNTER — Telehealth: Payer: Self-pay | Admitting: Physician Assistant

## 2023-02-03 NOTE — Telephone Encounter (Signed)
Patient states her mother will be staying with her for awhile and would like Lelon Mast to be her PCP while in [place] (lives in [place]).. Mother will be here from 02/04/23 through 02/10/23 and would like Mother to be seen during that time frame.  Patient was advised the next available New Patient appointment is 04/13/23.  Patient requested a message be sent to [facility] with the above request.

## 2023-02-03 NOTE — Telephone Encounter (Signed)
Spoke to pt told her per Lelon Mast, We can not accommodate this request. If patient needs anything she will have  To go to an Urgent Care. Pt verbalized understanding.

## 2023-02-03 NOTE — Telephone Encounter (Signed)
Please see message and advise 

## 2023-02-04 ENCOUNTER — Telehealth: Payer: Self-pay | Admitting: Physician Assistant

## 2023-02-04 NOTE — Telephone Encounter (Signed)
Barbara Cardenas, pt wants to know if it is okay to take her Xanax 0.5 mg and Amlodipine 5 mg together at bedtime?

## 2023-02-04 NOTE — Telephone Encounter (Signed)
Pt has a question on 2 meds that she is taking. Please call pt back.

## 2023-02-04 NOTE — Telephone Encounter (Signed)
Left message on personal voicemail, Lelon Mast said okay to take both medications at the same time. Any questions please call office.

## 2023-02-04 NOTE — Telephone Encounter (Signed)
Spoke to pt asked her how can I help you? Pt wants to know if it is okay to take her Xanax 0.5 mg and Amlodipine 5 mg together at bedtime? Told pt I will send message to [facility] and get back to you. Pt verbalized understanding.

## 2023-02-12 ENCOUNTER — Ambulatory Visit: Payer: 59

## 2023-03-06 ENCOUNTER — Ambulatory Visit
Admission: RE | Admit: 2023-03-06 | Discharge: 2023-03-06 | Disposition: A | Discharge status: Home / Self Care | Payer: 59 | Source: Ambulatory Visit | Attending: Obstetrics and Gynecology | Admitting: Obstetrics and Gynecology

## 2023-03-06 DIAGNOSIS — Z1231 Encounter for screening mammogram for malignant neoplasm of breast: Secondary | ICD-10-CM

## 2023-03-23 ENCOUNTER — Other Ambulatory Visit: Payer: Self-pay | Admitting: Physician Assistant

## 2023-03-23 NOTE — Telephone Encounter (Signed)
Last Fill: 06/17/21  Last OV: 11/03/22 Next OV: None Scheduled  Routing to provider for review/authorization.

## 2023-03-24 MED ORDER — HYDROXYZINE HCL 25 MG PO TABS: ORAL_TABLET | ORAL | 1 refills | Status: DC

## 2023-05-08 ENCOUNTER — Other Ambulatory Visit: Payer: Self-pay | Admitting: Physician Assistant

## 2023-05-08 DIAGNOSIS — F419 Anxiety disorder, unspecified: Secondary | ICD-10-CM

## 2023-05-08 NOTE — Telephone Encounter (Signed)
 Pt requesting refill for Alprazolam 0.5 mg tablet. Last OV 11/03/2022.

## 2023-05-21 ENCOUNTER — Encounter: Payer: Self-pay | Admitting: Family

## 2023-05-21 ENCOUNTER — Ambulatory Visit: Admitting: Family

## 2023-05-21 VITALS — BP 144/89 | HR 89 | Temp 97.8°F | Ht 68.0 in | Wt 190.8 lb

## 2023-05-21 DIAGNOSIS — J0110 Acute frontal sinusitis, unspecified: Secondary | ICD-10-CM

## 2023-05-21 MED ORDER — AMOXICILLIN-POT CLAVULANATE 875-125 MG PO TABS: 1.0000 | ORAL_TABLET | Freq: Two times a day (BID) | ORAL | 0 refills | Status: DC

## 2023-05-21 NOTE — Progress Notes (Signed)
 Patient ID: Barbara Cardenas, female    DOB: [birth year], 53 y.o.   MRN: [id number]  Chief Complaint  Patient presents with   Sinus Problem    Pt c/o sinus problem  for 2 weeks, sneezing, runny nose, headaches behind eyes and top of head. Has tried tylenol and aleve which did help headaches.        Discussed the use of AI scribe software for clinical note transcription with the patient, who gave verbal consent to proceed.  History of Present Illness The patient, with a history of severe allergies, presents with symptoms suggestive of a sinus infection. She has previously undergone allergy shots in [place], [place], and [place], but had to stop due to the development of Bell's palsy, which she suspects was related to the shots. The Bell's palsy resolved fairly quickly. She has been monitoring the pollen count and noticed that her symptoms worsened when the count exceeded ten. She has been experiencing sinus pain, teeth pain, and green mucus. The patient also reports pressure behind her eyes and increased head pain over the past two days. She has been using saline nose drops as she cannot use Flonase due to it causing increased eye pressure. She also avoids Sudafed due to its effect on her blood pressure. In addition to her allergy and sinus symptoms, the patient is also on amlodipine for high blood pressure and hydroxyzine to aid sleep. She has been taking Zyrtec for her allergies but is considering switching back to Xyzal.  Assessment & Plan Sinusitis - Symptoms suggest sinusitis likely due to allergies or viral infection. History of severe allergies and similar episodes. Flonase and Sudafed contraindicated due to eye pressure and hypertension. - Prescribe Augmentin bid for 7 days, take after eating. Can discontinue if symptoms improve after 5 days. - Increase water intake and continue saline nasal drops. - Administer Zyrtec twice daily. Consider Xyzal if Zyrtec causes  drowsiness or is ineffective. - Use Afrin nasal spray for no more than three days for severe congestion. -Call office back if no improvement after finishing abt.   Subjective:    Outpatient Medications Prior to Visit  Medication Sig Dispense Refill   acetaminophen (TYLENOL) 500 MG tablet Take 1,000 mg by mouth daily as needed for moderate pain or headache.     albuterol (VENTOLIN HFA) 108 (90 Base) MCG/ACT inhaler Inhale 2 puffs into the lungs every 6 (six) hours as needed for wheezing or shortness of breath.      ALPRAZolam (XANAX) 0.5 MG tablet TAKE 1 TABLET (0.5 MG TOTAL) BY MOUTH AS NEEDED (FLYING). 30 tablet 0   amLODipine (NORVASC) 5 MG tablet TAKE 1 TABLET (5 MG TOTAL) BY MOUTH DAILY. 90 tablet 1   diphenhydrAMINE (BENADRYL) 25 MG tablet Take 25 mg by mouth daily as needed for allergies.     hydrOXYzine (ATARAX) 25 MG tablet Take 0.5-1 tablet nightly as needed for sleep. 90 tablet 1   ibuprofen (ADVIL) 200 MG tablet Take 400 mg by mouth daily as needed for headache or moderate pain.     levothyroxine (SYNTHROID) 100 MCG tablet Take 1 tablet (100 mcg total) by mouth daily before breakfast. 90 tablet 0   loratadine (CLARITIN) 10 MG tablet Take 10 mg by mouth daily.     meloxicam (MOBIC) 7.5 MG tablet Take 1 tablet (7.5 mg total) by mouth daily. 30 tablet 0   Multiple Vitamin (MULTIVITAMIN WITH MINERALS) TABS tablet Take 1 tablet by mouth 3 (three) times a week.  Olopatadine HCl 0.2 % SOLN Place 1-2 drops into both eyes daily as needed (allergies).     nitroGLYCERIN (NITRODUR - DOSED IN MG/24 HR) 0.2 mg/hr patch APPLY 1/4 PATCH DAILY TO TENDON FOR TENDONITIS. (Patient not taking: Reported on 05/21/2023) 30 patch 1   Olopatadine HCl 0.6 % SOLN Place 2 sprays into both nostrils 2 (two) times daily. (Patient not taking: Reported on 05/21/2023)     pantoprazole (PROTONIX) 40 MG tablet TAKE 1 TABLET BY MOUTH EVERY DAY (Patient not taking: Reported on 05/21/2023) 90 tablet 1   sucralfate  (CARAFATE) 1 g tablet Take 1 tablet (1 g total) by mouth 4 (four) times daily -  with meals and at bedtime. (Patient not taking: Reported on 05/21/2023) 28 tablet 0   trimethoprim-polymyxin b (POLYTRIM) ophthalmic solution Place 2 drops into both eyes every 4 (four) hours. (Patient not taking: Reported on 05/21/2023) 10 mL 0   No facility-administered medications prior to visit.   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  Constitutional:      Appearance: Normal appearance. She is ill-appearing.     Interventions: Face mask in place.  HENT:     Right Ear: Tympanic membrane and ear canal normal.     Left Ear: Tympanic membrane and ear canal normal.     Nose:     Right Sinus: Maxillary sinus tenderness and frontal sinus tenderness present.     Left Sinus: Maxillary sinus tenderness and frontal sinus tenderness present.     Mouth/Throat:     Mouth: Mucous membranes are moist.     Pharynx: Posterior oropharyngeal erythema (mild) present. No pharyngeal swelling, oropharyngeal exudate or uvula swelling.     Tonsils: No  tonsillar exudate or tonsillar abscesses.  Cardiovascular:     Rate and Rhythm: Normal rate and regular rhythm.  Pulmonary:     Effort: Pulmonary effort is normal.     Breath sounds: Normal breath sounds.  Musculoskeletal:        General: Normal range of motion.  Lymphadenopathy:     Head:     Right side of head: No preauricular or posterior auricular adenopathy.     Left side of head: No preauricular or posterior auricular adenopathy.     Cervical: No cervical adenopathy.  Skin:    General: Skin is warm and dry.  Neurological:     Mental Status: She is alert.  Psychiatric:        Mood and Affect: Mood normal.        Behavior: Behavior normal.  BP (!) 144/89 (BP Location: Left Arm, Patient Position: Sitting, Cuff Size: Large)   Pulse 89   Temp 97.8 F (36.6 C) (Temporal)   Ht 5\' 8"  (1.727 m)   Wt 190 lb 12.8 oz (86.5 kg)   LMP 02/26/2021   SpO2 96%   BMI 29.01 kg/m  Wt Readings from Last 3 Encounters:  05/21/23 190 lb 12.8 oz (86.5 kg)  11/03/22 186 lb 9.6 oz (84.6 kg)  09/17/22 189 lb 12.8 oz (86.1 kg)      Dulce Sellar, NP

## 2023-08-12 ENCOUNTER — Other Ambulatory Visit: Payer: Self-pay | Admitting: Physician Assistant

## 2023-08-25 ENCOUNTER — Other Ambulatory Visit: Payer: Self-pay | Admitting: Physician Assistant

## 2023-09-02 IMAGING — MG MM DIGITAL SCREENING BILAT W/ TOMO AND CAD
8 series · 8 of 24 positions shown · non-contrast
Comparison: Previous exam(s).

CLINICAL DATA: Screening.

EXAM:
DIGITAL SCREENING BILATERAL MAMMOGRAM WITH TOMOSYNTHESIS AND CAD
TECHNIQUE: Bilateral screening digital craniocaudal and mediolateral oblique
mammograms were obtained. Bilateral screening digital breast
tomosynthesis was performed. The images were evaluated with
computer-aided detection.

[L CC synth-2D]
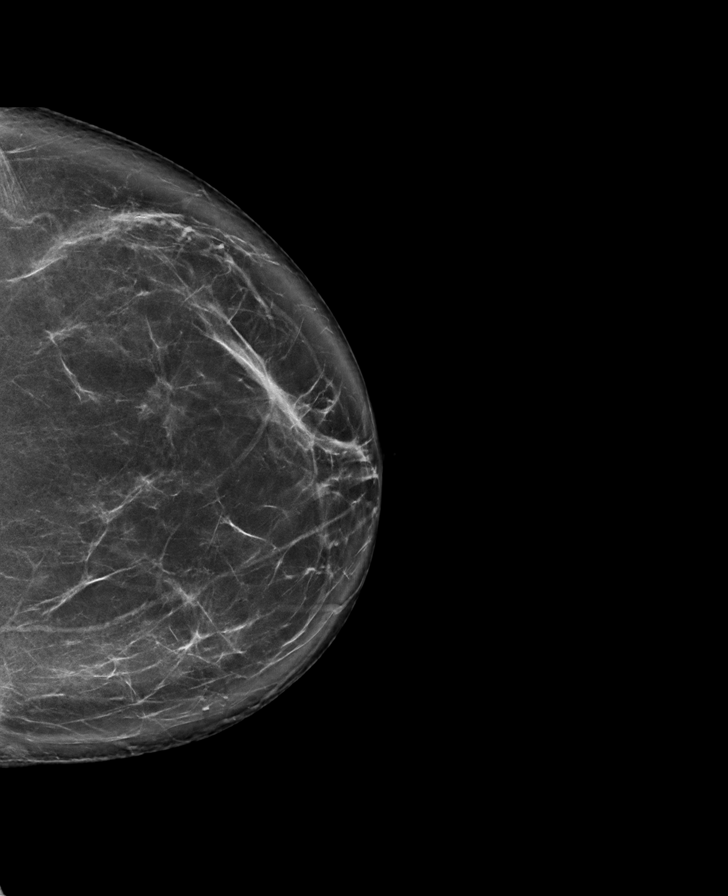

[R MLO synth-2D]
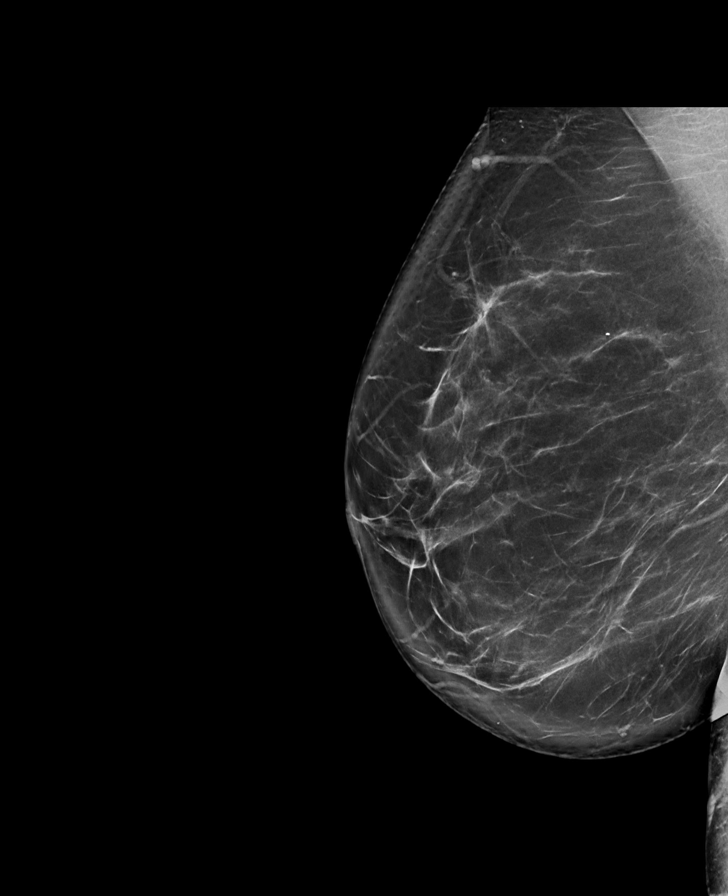

[L MLO synth-2D]
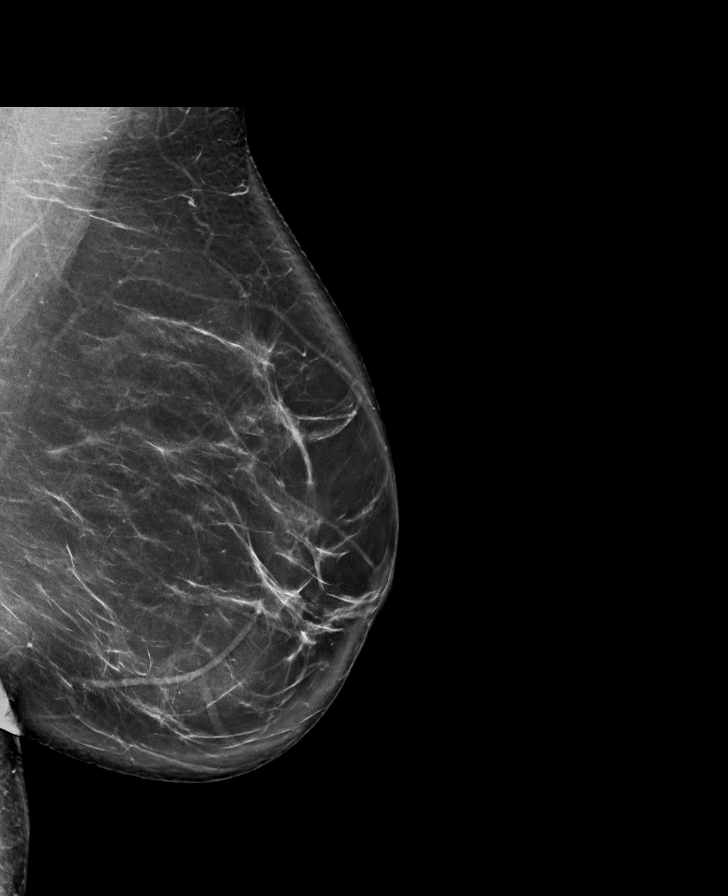

[R CC synth-2D]
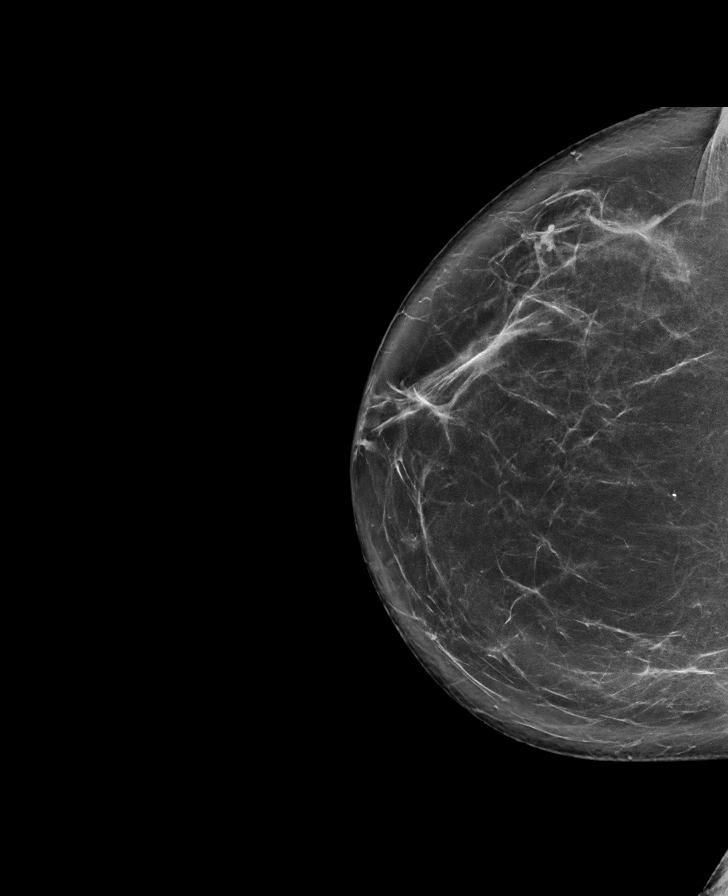

[R MLO tomo · tomo slice 51/100.0]
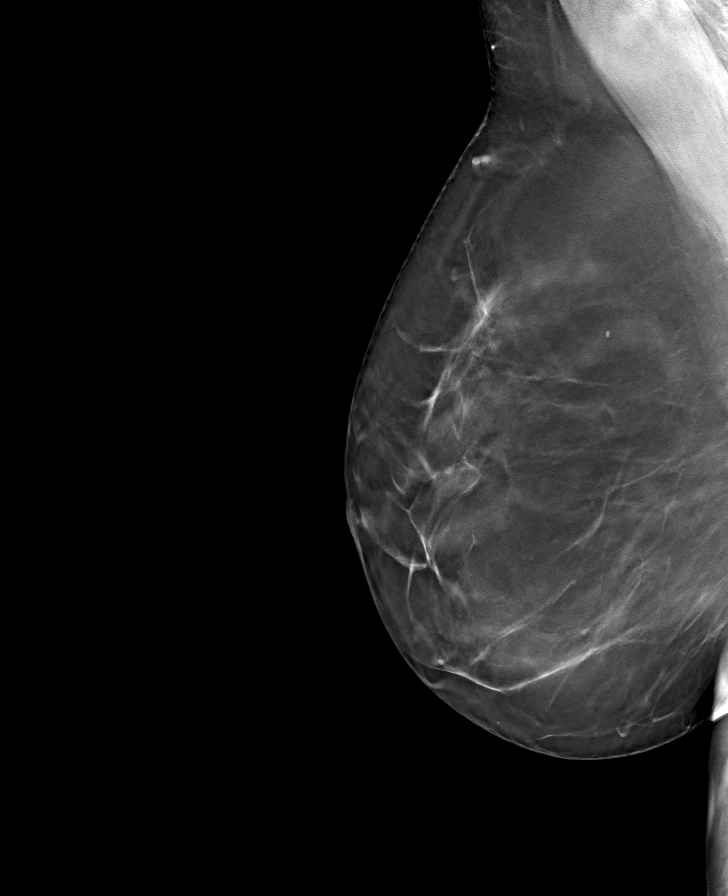

[L CC tomo · tomo slice 47/92.0]
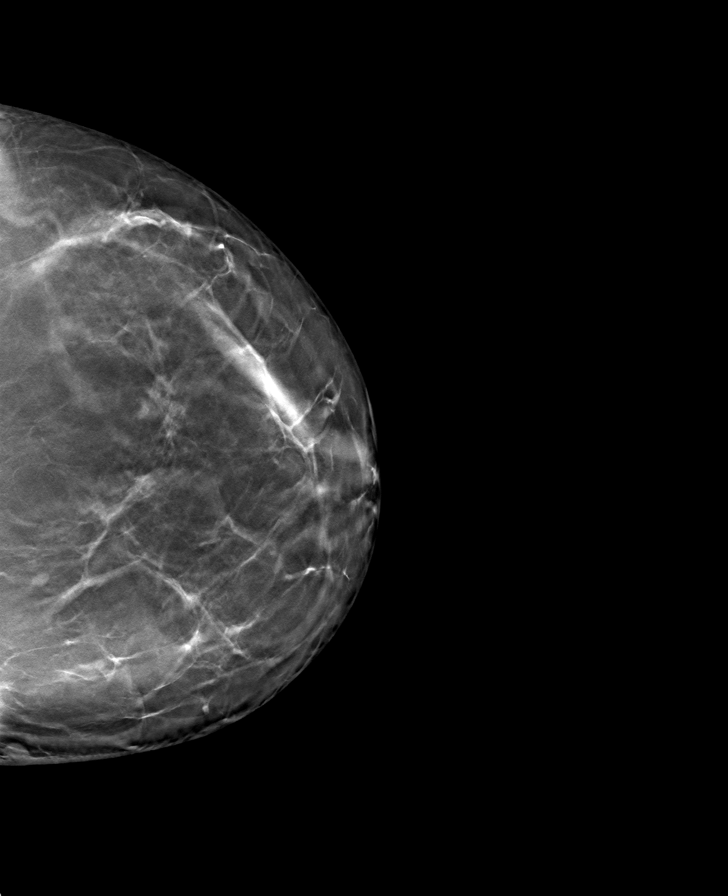

[R CC tomo · tomo slice 47/92.0]
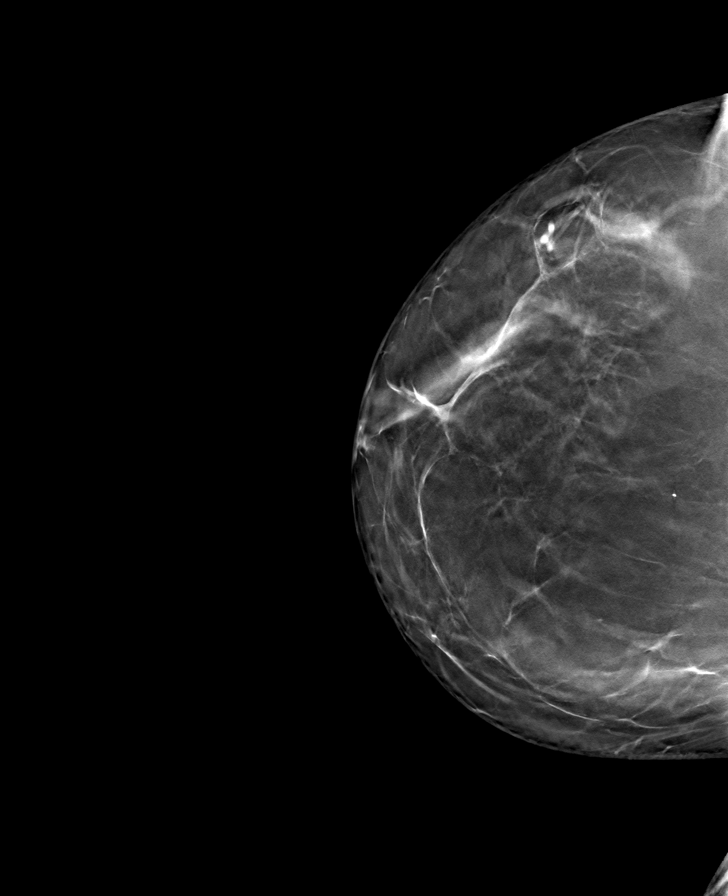

[L MLO tomo · tomo slice 49/98.0]
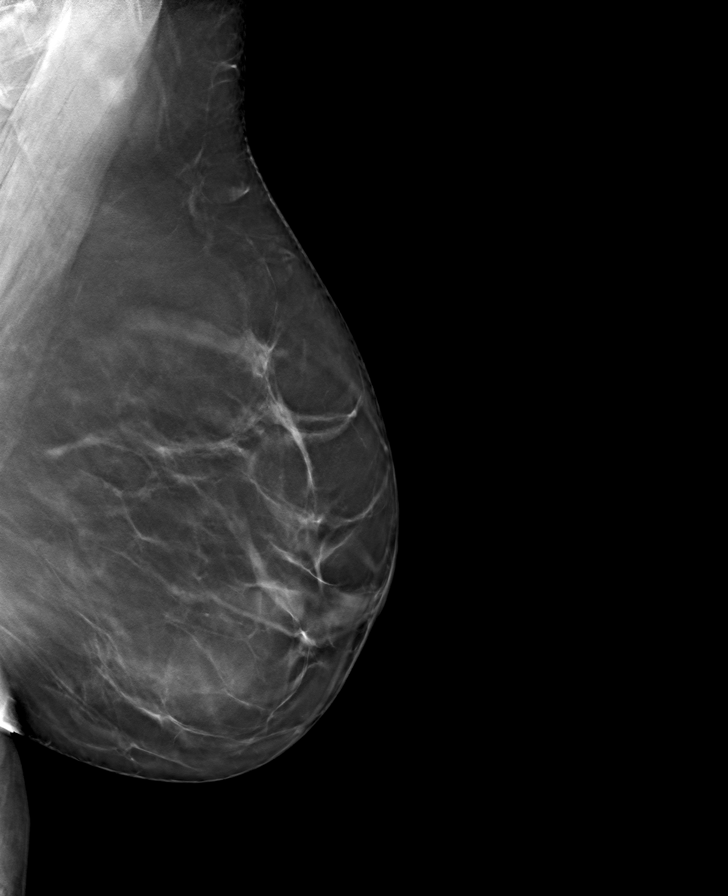

[8 of 24 positions shown; findings below may reference images not displayed]

ACR Breast Density Category b: There are scattered areas of
fibroglandular density.
FINDINGS: There are no findings suspicious for malignancy.
IMPRESSION: No mammographic evidence of malignancy. A result letter of this
screening mammogram will be mailed directly to the patient.

RECOMMENDATION:
Screening mammogram in one year. (Code:51-O-LD2)

BI-RADS CATEGORY  1: Negative.

## 2023-11-04 ENCOUNTER — Encounter: Payer: Self-pay | Admitting: Physician Assistant

## 2023-11-04 ENCOUNTER — Ambulatory Visit: Admitting: Physician Assistant

## 2023-11-04 VITALS — BP 138/86 | HR 74 | Temp 97.4°F | Ht 68.0 in | Wt 188.2 lb

## 2023-11-04 DIAGNOSIS — E7800 Pure hypercholesterolemia, unspecified: Secondary | ICD-10-CM | POA: Diagnosis not present

## 2023-11-04 DIAGNOSIS — R229 Localized swelling, mass and lump, unspecified: Secondary | ICD-10-CM

## 2023-11-04 DIAGNOSIS — Z23 Encounter for immunization: Secondary | ICD-10-CM | POA: Diagnosis not present

## 2023-11-04 DIAGNOSIS — M2550 Pain in unspecified joint: Secondary | ICD-10-CM | POA: Diagnosis not present

## 2023-11-04 DIAGNOSIS — E559 Vitamin D deficiency, unspecified: Secondary | ICD-10-CM | POA: Diagnosis not present

## 2023-11-04 DIAGNOSIS — E890 Postprocedural hypothyroidism: Secondary | ICD-10-CM | POA: Diagnosis not present

## 2023-11-04 DIAGNOSIS — Z85850 Personal history of malignant neoplasm of thyroid: Secondary | ICD-10-CM | POA: Diagnosis not present

## 2023-11-04 NOTE — Progress Notes (Signed)
 Barbara Cardenas is a 53 y.o. female here for a new problem.  History of Present Illness:   Chief Complaint  Patient presents with   Mass    Pt c/o lump bottom right jaw, noticed it Saturday and has increased in size.    Discussed the use of AI scribe software for clinical note transcription with the patient, who gave verbal consent to proceed.  History of Present Illness Barbara Cardenas is a 53 year old female who presents with a lump in her right jaw.  The lump in her right jaw was first noticed on Saturday and has increased in size since then, though tenderness has decreased. She has not taken any specific treatment for the lump. There is no history of recent dental work or upper respiratory infections. She experiences seasonal allergies but has not been sick recently.  She has a history of thyroid  cancer diagnosed in 2017, which led to thyroid  removal. She is postmenopausal, with periods ceasing within a year after thyroid  surgery. She experiences intermittent joint pain in the hips, shoulders, and knees, with stiffness and pain sometimes disrupting sleep, particularly in the left knee.  She is actively trying to lose weight by walking daily and reducing calorie intake but has not seen significant weight loss. Her family history includes high blood pressure and high cholesterol. Her mother-in-law was diagnosed with COVID over the weekend, but she does not have frequent contact. She uses insect repellent and has not noticed any tick bites.  She has history of HLD and would like lipid panel checked.     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   Seasonal allergies 07/20/2016   Thyroid   cancer (HCC)    Vision abnormalities      Social History   Tobacco Use   Smoking status: Never   Smokeless tobacco: Never  Vaping Use   Vaping status: Never Used  Substance Use Topics   Alcohol use: Yes    Comment: rare glass of wine   Drug use: No    Past Surgical History:  Procedure Laterality Date   DILATION AND CURETTAGE OF UTERUS     THYROIDECTOMY, PARTIAL     full   VIDEO BRONCHOSCOPY WITH ENDOBRONCHIAL ULTRASOUND N/A 09/08/2018   Procedure: VIDEO BRONCHOSCOPY WITH ENDOBRONCHIAL ULTRASOUND AND BIOPSIES;  Surgeon: Shelah Lamar RAMAN, MD;  Location: [facility] OR;  Service: Thoracic;  Laterality: N/A;    Family History  Problem Relation Age of Onset   Healthy Mother    CAD Father    Allergies Father    Ulcerative colitis Father    Tremor Father    Neuropathy Neg Hx     Allergies  Allergen Reactions   Latex Hives   Pseudoephedrine-Guaifenesin Er Other (See Comments)    Current Medications:   Current Outpatient Medications:    acetaminophen  (TYLENOL ) 500 MG tablet, Take 1,000 mg by mouth daily as needed for moderate pain or headache., Disp: , Rfl:    albuterol (VENTOLIN HFA) 108 (90 Base) MCG/ACT inhaler, Inhale 2 puffs into the lungs every 6 (six) hours as needed for wheezing or shortness of breath. , Disp: , Rfl:    ALPRAZolam  (XANAX ) 0.5 MG tablet,  TAKE 1 TABLET (0.5 MG TOTAL) BY MOUTH AS NEEDED (FLYING)., Disp: 30 tablet, Rfl: 0   amLODipine  (NORVASC ) 5 MG tablet, TAKE 1 TABLET (5 MG TOTAL) BY MOUTH DAILY., Disp: 90 tablet, Rfl: 0   diphenhydrAMINE (BENADRYL) 25 MG tablet, Take 25 mg by mouth daily as needed for allergies., Disp: , Rfl:    hydrOXYzine  (ATARAX ) 25 MG tablet, TAKE 0.5-1 TABLET NIGHTLY AS NEEDED FOR SLEEP., Disp: 90 tablet, Rfl: 1   ibuprofen (ADVIL) 200 MG tablet, Take 400 mg by mouth daily as needed for headache or moderate pain., Disp: , Rfl:    levothyroxine  (SYNTHROID ) 100 MCG tablet, Take 1 tablet (100 mcg total) by mouth daily before breakfast.,  Disp: 90 tablet, Rfl: 0   loratadine (CLARITIN) 10 MG tablet, Take 10 mg by mouth daily., Disp: , Rfl:    meloxicam  (MOBIC ) 7.5 MG tablet, Take 1 tablet (7.5 mg total) by mouth daily., Disp: 30 tablet, Rfl: 0   Multiple Vitamin (MULTIVITAMIN WITH MINERALS) TABS tablet, Take 1 tablet by mouth 3 (three) times a week. , Disp: , Rfl:    Olopatadine HCl 0.2 % SOLN, Place 1-2 drops into both eyes daily as needed (allergies)., Disp: , Rfl:    Review of Systems:   Negative unless otherwise specified per HPI.  Vitals:   Vitals:   11/04/23 1441  BP: 138/86  Pulse: 74  Temp: (!) 97.4 F (36.3 C)  TempSrc: Temporal  SpO2: 98%  Weight: 188 lb 4 oz (85.4 kg)  Height: 5' 8 (1.727 m)     Body mass index is 28.62 kg/m.  Physical Exam:   Physical Exam Vitals and nursing note reviewed.  Constitutional:      General: She is not in acute distress.    Appearance: She is well-developed. She is not ill-appearing or toxic-appearing.  HENT:     Head:   Cardiovascular:     Rate and Rhythm: Normal rate and regular rhythm.     Pulses: Normal pulses.     Heart sounds: Normal heart sounds, S1 normal and S2 normal.  Pulmonary:     Effort: Pulmonary effort is normal.     Breath sounds: Normal breath sounds.  Skin:    General: Skin is warm and dry.  Neurological:     Mental Status: She is alert.     GCS: GCS eye subscore is 4. GCS verbal subscore is 5. GCS motor subscore is 6.  Psychiatric:        Speech: Speech normal.        Behavior: Behavior normal. Behavior is cooperative.     Assessment and Plan:   Assessment and Plan Assessment & Plan Localized skin lump, mass or swelling Tender lump in right jaw, increasing in size. - Order imaging of the right jaw. Management based on results.  Arthralgia  Intermittent joint pain with morning stiffness. Previous autoimmune testing unremarkable. Potential for rheumatoid arthritis or other autoimmune conditions. - Repeat autoimmune panel to  evaluate for rheumatoid arthritis or other autoimmune conditions.  History of thyroid  cancer Post-thyroidectomy hypothyroidism managed with levothyroxine . Weight management challenges discussed. Advised against weight loss medications due to thyroid  cancer history. - Continue current management with levothyroxine . - Encourage continued exercise and calorie reduction. - Discuss potential for future oral weight management medication.  HLD Update lipid panel and make recommendations accordingly  Vitamin D  deficiency Update vitamin D  and provide recommendations     Lucie Buttner, PA-C

## 2023-11-05 LAB — CBC WITH DIFFERENTIAL/PLATELET
Basophils Absolute: 0.1 K/uL (ref 0.0–0.1)
Basophils Relative: 1.1 % (ref 0.0–3.0)
Eosinophils Absolute: 0.1 K/uL (ref 0.0–0.7)
Eosinophils Relative: 2.3 % (ref 0.0–5.0)
HCT: 40 % (ref 36.0–46.0)
Hemoglobin: 13.2 g/dL (ref 12.0–15.0)
Lymphocytes Relative: 22.1 % (ref 12.0–46.0)
Lymphs Abs: 1.2 K/uL (ref 0.7–4.0)
MCHC: 33 g/dL (ref 30.0–36.0)
MCV: 84.9 fl (ref 78.0–100.0)
Monocytes Absolute: 0.4 K/uL (ref 0.1–1.0)
Monocytes Relative: 7.9 % (ref 3.0–12.0)
Neutro Abs: 3.7 K/uL (ref 1.4–7.7)
Neutrophils Relative %: 66.6 % (ref 43.0–77.0)
Platelets: 253 K/uL (ref 150.0–400.0)
RBC: 4.71 Mil/uL (ref 3.87–5.11)
RDW: 13.8 % (ref 11.5–15.5)
WBC: 5.5 K/uL (ref 4.0–10.5)

## 2023-11-05 LAB — COMPREHENSIVE METABOLIC PANEL WITH GFR
ALT: 16 U/L (ref 0–35)
AST: 17 U/L (ref 0–37)
Albumin: 4.6 g/dL (ref 3.5–5.2)
Alkaline Phosphatase: 90 U/L (ref 39–117)
BUN: 12 mg/dL (ref 6–23)
CO2: 25 meq/L (ref 19–32)
Calcium: 10 mg/dL (ref 8.4–10.5)
Chloride: 105 meq/L (ref 96–112)
Creatinine, Ser: 0.7 mg/dL (ref 0.40–1.20)
GFR: 99.73 mL/min (ref >60.00)
Glucose, Bld: 86 mg/dL (ref 70–99)
Potassium: 3.9 meq/L (ref 3.5–5.1)
Sodium: 141 meq/L (ref 135–145)
Total Bilirubin: 0.4 mg/dL (ref 0.2–1.2)
Total Protein: 7.6 g/dL (ref 6.0–8.3)

## 2023-11-05 LAB — VITAMIN B12: Vitamin B-12: 424 pg/mL (ref 211–911)

## 2023-11-05 LAB — ANA: Anti Nuclear Antibody (ANA): NEGATIVE

## 2023-11-05 LAB — VITAMIN D 25 HYDROXY (VIT D DEFICIENCY, FRACTURES): VITD: 29.71 ng/mL — ABNORMAL LOW (ref 30.00–100.00)

## 2023-11-05 LAB — C-REACTIVE PROTEIN: CRP: <1 mg/dL (ref 0.5–20.0)

## 2023-11-05 LAB — LIPID PANEL
Cholesterol: 233 mg/dL — ABNORMAL HIGH (ref 0–200)
HDL: 60.2 mg/dL (ref >39.00)
LDL Cholesterol: 125 mg/dL — ABNORMAL HIGH (ref 0–99)
NonHDL: 172.66
Total CHOL/HDL Ratio: 4
Triglycerides: 236 mg/dL — ABNORMAL HIGH (ref 0.0–149.0)
VLDL: 47.2 mg/dL — ABNORMAL HIGH (ref 0.0–40.0)

## 2023-11-05 LAB — RHEUMATOID FACTOR: Rheumatoid fact SerPl-aCnc: <10 [IU]/mL (ref <14)

## 2023-11-05 LAB — SEDIMENTATION RATE: Sed Rate: 22 mm/h (ref 0–30)

## 2023-11-05 LAB — TSH: TSH: 0.82 u[IU]/mL (ref 0.35–5.50)

## 2023-11-06 ENCOUNTER — Ambulatory Visit: Payer: Self-pay | Admitting: Physician Assistant

## 2023-11-07 ENCOUNTER — Other Ambulatory Visit: Payer: Self-pay | Admitting: Physician Assistant

## 2023-11-08 ENCOUNTER — Ambulatory Visit (HOSPITAL_BASED_OUTPATIENT_CLINIC_OR_DEPARTMENT_OTHER)
Admission: RE | Admit: 2023-11-08 | Discharge: 2023-11-08 | Disposition: A | Discharge status: Home / Self Care | Source: Ambulatory Visit | Attending: Physician Assistant | Admitting: Physician Assistant

## 2023-11-08 DIAGNOSIS — R229 Localized swelling, mass and lump, unspecified: Secondary | ICD-10-CM | POA: Diagnosis present

## 2023-12-25 ENCOUNTER — Encounter: Payer: Self-pay | Admitting: Physician Assistant

## 2023-12-25 ENCOUNTER — Ambulatory Visit: Admitting: Physician Assistant

## 2023-12-25 VITALS — BP 118/80 | HR 96 | Temp 97.3°F | Ht 68.0 in | Wt 187.0 lb

## 2023-12-25 DIAGNOSIS — K219 Gastro-esophageal reflux disease without esophagitis: Secondary | ICD-10-CM | POA: Diagnosis not present

## 2023-12-25 DIAGNOSIS — H6991 Unspecified Eustachian tube disorder, right ear: Secondary | ICD-10-CM

## 2023-12-25 MED ORDER — PANTOPRAZOLE SODIUM 40 MG PO TBEC: 40.0000 mg | DELAYED_RELEASE_TABLET | Freq: Every day | ORAL | 1 refills | Status: DC

## 2023-12-25 NOTE — Progress Notes (Signed)
 Barbara Cardenas is a 53 y.o. female here for a follow up of a pre-existing problem.  History of Present Illness:   Chief Complaint  Patient presents with   Gastroesophageal Reflux    Pt c/o increase in acid reflux past 4-6 weeks, worse at night.   Otalgia    Pt c/o right ear pain, sinus pressure, started 7-10 days ago.    Discussed the use of AI scribe software for clinical note transcription with the patient, who gave verbal consent to proceed.  History of Present Illness   Barbara Cardenas is a 53 year old female who presents with heartburn and right ear pain.  For the past four to six weeks, she experiences a fluttering sensation in her chest, often followed by burping, which has worsened and occurs throughout the day, especially when standing. She has a history of acid reflux and uses Tums for heartburn, with previous success using Prilosec and Nexium. She recently started drinking coffee two weeks ago, which she believes worsened her symptoms, and has since stopped, with her last cup being yesterday.  She feels 'steery and shaky' due to the frequency of these episodes, contributing to her anxiety. Her diet now includes more almonds, yogurt, and flaxseed to manage cholesterol and triglycerides, but she has not lost weight.  She also experiences right ear pain, likely due to allergies, and uses over-the-counter antihistamines like Claritin or Zyrtec and saline nose drops. She avoids steroid nasal sprays due to increased eye pressure. She denies fever, chills, severe sore throat, or cough.        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   Seasonal allergies 07/20/2016   Thyroid  cancer  (HCC)    Vision abnormalities      Social History   Tobacco Use   Smoking status: Never   Smokeless tobacco: Never  Vaping Use   Vaping status: Never Used  Substance Use Topics   Alcohol use: Yes    Comment: rare glass of wine   Drug use: No    Past Surgical History:  Procedure Laterality Date   DILATION AND CURETTAGE OF UTERUS     THYROIDECTOMY, PARTIAL     full   VIDEO BRONCHOSCOPY WITH ENDOBRONCHIAL ULTRASOUND N/A 09/08/2018   Procedure: VIDEO BRONCHOSCOPY WITH ENDOBRONCHIAL ULTRASOUND AND BIOPSIES;  Surgeon: Shelah Lamar RAMAN, MD;  Location: [facility] OR;  Service: Thoracic;  Laterality: N/A;    Family History  Problem Relation Age of Onset   Healthy Mother    CAD Father    Allergies Father    Ulcerative colitis Father    Tremor Father    Neuropathy Neg Hx     Allergies  Allergen Reactions   Latex Hives   Pseudoephedrine-Guaifenesin Er Other (See Comments)    Current Medications:   Current Outpatient Medications:    acetaminophen  (TYLENOL ) 500 MG tablet, Take 1,000 mg by mouth daily as needed for moderate pain or headache., Disp: , Rfl:    albuterol (VENTOLIN HFA) 108 (90 Base) MCG/ACT inhaler, Inhale 2 puffs into the lungs every 6 (six) hours as needed for wheezing or shortness of breath. , Disp: , Rfl:    ALPRAZolam  (XANAX ) 0.5 MG tablet,  TAKE 1 TABLET (0.5 MG TOTAL) BY MOUTH AS NEEDED (FLYING)., Disp: 30 tablet, Rfl: 0   amLODipine  (NORVASC ) 5 MG tablet, TAKE 1 TABLET (5 MG TOTAL) BY MOUTH DAILY., Disp: 90 tablet, Rfl: 0   diphenhydrAMINE (BENADRYL) 25 MG tablet, Take 25 mg by mouth daily as needed for allergies., Disp: , Rfl:    hydrOXYzine  (ATARAX ) 25 MG tablet, TAKE 0.5-1 TABLET NIGHTLY AS NEEDED FOR SLEEP., Disp: 90 tablet, Rfl: 1   ibuprofen (ADVIL) 200 MG tablet, Take 400 mg by mouth daily as needed for headache or moderate pain., Disp: , Rfl:    levothyroxine  (SYNTHROID ) 100 MCG tablet, Take 1 tablet (100 mcg total) by mouth daily before breakfast., Disp: 90  tablet, Rfl: 0   loratadine (CLARITIN) 10 MG tablet, Take 10 mg by mouth daily., Disp: , Rfl:    Multiple Vitamin (MULTIVITAMIN WITH MINERALS) TABS tablet, Take 1 tablet by mouth 3 (three) times a week. , Disp: , Rfl:    Olopatadine HCl 0.2 % SOLN, Place 1-2 drops into both eyes daily as needed (allergies)., Disp: , Rfl:    pantoprazole  (PROTONIX ) 40 MG tablet, Take 1 tablet (40 mg total) by mouth daily., Disp: 30 tablet, Rfl: 1   Review of Systems:   Negative unless otherwise specified per HPI.  Vitals:   Vitals:   12/25/23 1034  BP: 118/80  Pulse: 96  Temp: (!) 97.3 F (36.3 C)  TempSrc: Temporal  SpO2: 98%  Weight: 187 lb (84.8 kg)  Height: 5' 8 (1.727 m)     Body mass index is 28.43 kg/m.  Physical Exam:   Physical Exam Vitals and nursing note reviewed.  Constitutional:      General: She is not in acute distress.    Appearance: She is well-developed. She is not ill-appearing or toxic-appearing.  HENT:     Head: Normocephalic and atraumatic.     Right Ear: Ear canal and external ear normal. A middle ear effusion is present. Tympanic membrane is not erythematous, retracted or bulging.     Left Ear: Tympanic membrane, ear canal and external ear normal. Tympanic membrane is not erythematous, retracted or bulging.     Nose: Nose normal.     Right Sinus: No maxillary sinus tenderness or frontal sinus tenderness.     Left Sinus: No maxillary sinus tenderness or frontal sinus tenderness.     Mouth/Throat:     Pharynx: Uvula midline. No posterior oropharyngeal erythema.  Eyes:     General: Lids are normal.     Conjunctiva/sclera: Conjunctivae normal.  Neck:     Trachea: Trachea normal.  Cardiovascular:     Rate and Rhythm: Normal rate and regular rhythm.     Heart sounds: Normal heart sounds, S1 normal and S2 normal.  Pulmonary:     Effort: Pulmonary effort is normal.     Breath sounds: Normal breath sounds. No decreased breath sounds, wheezing, rhonchi or rales.   Lymphadenopathy:     Cervical: No cervical adenopathy.  Skin:    General: Skin is warm and dry.  Neurological:     Mental Status: She is alert.  Psychiatric:        Speech: Speech normal.        Behavior: Behavior normal. Behavior is cooperative.     Assessment and Plan:   Assessment and Plan    Gastroesophageal reflux disease (GERD) Symptoms suggestive of GERD, likely exacerbated by coffee intake. Discussed potential esophageal cell changes due to acid exposure. No red flag  symptom(s) on exam or evidence to suggest acute coronary syndrome. - Prescribe pantoprazole . - Advise taking pantoprazole  in the evening, separate from levothyroxine . - Advise avoiding coffee and monitoring symptoms. - Consider GI referral for endoscopy if symptoms persist.  Eustachian tube dysfunction of right ear Right ear pain likely related to seasonal allergies. Unable to use steroid nasal sprays due to increased intraocular pressure. - Recommend continued use of saline nasal drops. - Advise using over-the-counter antihistamines as needed.   Lucie Buttner, PA-C

## 2024-01-17 ENCOUNTER — Other Ambulatory Visit: Payer: Self-pay | Admitting: Physician Assistant

## 2024-01-25 ENCOUNTER — Other Ambulatory Visit: Payer: Self-pay | Admitting: Nurse Practitioner

## 2024-01-25 DIAGNOSIS — Z1231 Encounter for screening mammogram for malignant neoplasm of breast: Secondary | ICD-10-CM

## 2024-02-03 ENCOUNTER — Other Ambulatory Visit: Payer: Self-pay | Admitting: Physician Assistant

## 2024-03-07 ENCOUNTER — Other Ambulatory Visit: Payer: Self-pay | Admitting: Physician Assistant

## 2024-03-08 ENCOUNTER — Other Ambulatory Visit: Payer: Self-pay | Admitting: Physician Assistant

## 2024-03-08 DIAGNOSIS — F419 Anxiety disorder, unspecified: Secondary | ICD-10-CM

## 2024-03-08 NOTE — Telephone Encounter (Unsigned)
 Copied from CRM [id number]. Topic: Clinical - Medication Refill >> Mar 08, 2024  9:06 AM Jakyia R wrote: Medication:  ALPRAZolam  (XANAX ) 0.5 MG tablet    Has the patient contacted their pharmacy? No (Agent: If no, request that the patient contact the pharmacy for the refill. If patient does not wish to contact the pharmacy document the reason why and proceed with request.) (Agent: If yes, when and what did the pharmacy advise?)  This is the patient's preferred pharmacy:  [id number] [address], [place] - [address] [place] [address] Phone: [phone] Fax: [phone]  Is this the correct pharmacy for this prescription? Yes If no, delete pharmacy and type the correct one.   Has the prescription been filled recently? No  Is the patient out of the medication? Yes  Has the patient been seen for an appointment in the last year OR does the patient have an upcoming appointment? Yes  Can we respond through MyChart? Yes  Agent: Please be advised that Rx refills may take up to 3 business days. We ask that you follow-up with your pharmacy.

## 2024-03-09 NOTE — Telephone Encounter (Signed)
 Pt requesting refill for Alprazolam  0.5 mg tablet. Last OV 12/25/2023.

## 2024-03-11 ENCOUNTER — Ambulatory Visit
Admission: RE | Admit: 2024-03-11 | Discharge: 2024-03-11 | Disposition: A | Discharge status: Home / Self Care | Source: Ambulatory Visit | Attending: Nurse Practitioner | Admitting: Nurse Practitioner

## 2024-03-11 DIAGNOSIS — Z1231 Encounter for screening mammogram for malignant neoplasm of breast: Secondary | ICD-10-CM
